# Patient Record
Sex: Female | Born: 1950 | Race: White | Hispanic: No | Marital: Married | Smoking: Former smoker
Health system: Southern US, Community
[De-identification: ages and names within clinical notes are randomized; demographics above are authoritative.]

## PROBLEM LIST (undated history)

## (undated) DIAGNOSIS — F419 Anxiety disorder, unspecified: Secondary | ICD-10-CM

## (undated) DIAGNOSIS — C801 Malignant (primary) neoplasm, unspecified: Secondary | ICD-10-CM

## (undated) DIAGNOSIS — I517 Cardiomegaly: Secondary | ICD-10-CM

## (undated) DIAGNOSIS — F329 Major depressive disorder, single episode, unspecified: Secondary | ICD-10-CM

## (undated) DIAGNOSIS — M1990 Unspecified osteoarthritis, unspecified site: Secondary | ICD-10-CM

## (undated) DIAGNOSIS — G4733 Obstructive sleep apnea (adult) (pediatric): Secondary | ICD-10-CM

## (undated) DIAGNOSIS — Z8711 Personal history of peptic ulcer disease: Secondary | ICD-10-CM

## (undated) DIAGNOSIS — Z8719 Personal history of other diseases of the digestive system: Secondary | ICD-10-CM

## (undated) DIAGNOSIS — I2510 Atherosclerotic heart disease of native coronary artery without angina pectoris: Secondary | ICD-10-CM

## (undated) DIAGNOSIS — I10 Essential (primary) hypertension: Secondary | ICD-10-CM

## (undated) DIAGNOSIS — K219 Gastro-esophageal reflux disease without esophagitis: Secondary | ICD-10-CM

## (undated) DIAGNOSIS — I219 Acute myocardial infarction, unspecified: Secondary | ICD-10-CM

## (undated) DIAGNOSIS — R011 Cardiac murmur, unspecified: Secondary | ICD-10-CM

## (undated) DIAGNOSIS — I444 Left anterior fascicular block: Secondary | ICD-10-CM

## (undated) DIAGNOSIS — E039 Hypothyroidism, unspecified: Secondary | ICD-10-CM

## (undated) DIAGNOSIS — Z9989 Dependence on other enabling machines and devices: Secondary | ICD-10-CM

## (undated) DIAGNOSIS — F32A Depression, unspecified: Secondary | ICD-10-CM

## (undated) DIAGNOSIS — Z862 Personal history of diseases of the blood and blood-forming organs and certain disorders involving the immune mechanism: Secondary | ICD-10-CM

## (undated) DIAGNOSIS — G5603 Carpal tunnel syndrome, bilateral upper limbs: Secondary | ICD-10-CM

## (undated) HISTORY — PX: COLONOSCOPY: SHX174

## (undated) HISTORY — PX: KNEE CARTILAGE SURGERY: SHX688

## (undated) HISTORY — DX: Morbid (severe) obesity due to excess calories: E66.01

## (undated) HISTORY — PX: UPPER GI ENDOSCOPY: SHX6162

## (undated) HISTORY — DX: Hypothyroidism, unspecified: E03.9

## (undated) HISTORY — PX: BACK SURGERY: SHX140

## (undated) HISTORY — PX: WISDOM TOOTH EXTRACTION: SHX21

## (undated) HISTORY — PX: ABDOMINAL HYSTERECTOMY: SHX81

## (undated) HISTORY — DX: Essential (primary) hypertension: I10

## (undated) HISTORY — PX: CHOLECYSTECTOMY: SHX55

## (undated) HISTORY — PX: CARPAL TUNNEL RELEASE: SHX101

## (undated) HISTORY — PX: CERVICAL FUSION: SHX112

---

## 1998-03-01 ENCOUNTER — Ambulatory Visit (HOSPITAL_COMMUNITY): Admission: RE | Admit: 1998-03-01 | Discharge: 1998-03-01 | Payer: Self-pay | Admitting: *Deleted

## 1999-08-30 ENCOUNTER — Other Ambulatory Visit: Admission: RE | Admit: 1999-08-30 | Discharge: 1999-08-30 | Payer: Self-pay | Admitting: *Deleted

## 2004-01-04 ENCOUNTER — Encounter: Admission: RE | Admit: 2004-01-04 | Discharge: 2004-04-03 | Payer: Self-pay | Admitting: *Deleted

## 2004-01-05 ENCOUNTER — Encounter: Admission: RE | Admit: 2004-01-05 | Discharge: 2004-01-05 | Payer: Self-pay | Admitting: *Deleted

## 2004-02-26 HISTORY — PX: LAPAROSCOPIC GASTRIC BANDING: SHX1100

## 2004-03-01 ENCOUNTER — Observation Stay (HOSPITAL_COMMUNITY): Admission: RE | Admit: 2004-03-01 | Discharge: 2004-03-02 | Payer: Self-pay | Admitting: *Deleted

## 2004-04-26 ENCOUNTER — Encounter: Admission: RE | Admit: 2004-04-26 | Discharge: 2004-07-25 | Payer: Self-pay | Admitting: *Deleted

## 2004-05-06 ENCOUNTER — Encounter: Admission: RE | Admit: 2004-05-06 | Discharge: 2004-05-06 | Payer: Self-pay | Admitting: *Deleted

## 2004-08-25 ENCOUNTER — Encounter: Admission: RE | Admit: 2004-08-25 | Discharge: 2004-11-23 | Payer: Self-pay | Admitting: *Deleted

## 2006-04-02 ENCOUNTER — Ambulatory Visit (HOSPITAL_COMMUNITY): Admission: RE | Admit: 2006-04-02 | Discharge: 2006-04-02 | Payer: Self-pay | Admitting: *Deleted

## 2007-01-24 ENCOUNTER — Ambulatory Visit (HOSPITAL_COMMUNITY): Admission: RE | Admit: 2007-01-24 | Discharge: 2007-01-24 | Payer: Self-pay | Admitting: *Deleted

## 2009-09-29 ENCOUNTER — Ambulatory Visit (HOSPITAL_COMMUNITY): Admission: RE | Admit: 2009-09-29 | Discharge: 2009-09-29 | Payer: Self-pay | Admitting: Surgery

## 2011-01-05 ENCOUNTER — Encounter (HOSPITAL_COMMUNITY): Payer: PRIVATE HEALTH INSURANCE | Attending: Surgery

## 2011-01-05 LAB — BASIC METABOLIC PANEL
Chloride: 106 mEq/L (ref 96–112)
GFR calc Af Amer: >60 mL/min (ref >60)
GFR calc non Af Amer: >60 mL/min (ref >60)
Potassium: 4.1 mEq/L (ref 3.5–5.1)

## 2011-01-05 LAB — CBC
Platelets: 261 10*3/uL (ref 150–400)
RBC: 4.52 MIL/uL (ref 3.87–5.11)
WBC: 7.5 10*3/uL (ref 4.0–10.5)

## 2011-01-05 LAB — SURGICAL PCR SCREEN
MRSA, PCR: NEGATIVE
Staphylococcus aureus: NEGATIVE

## 2011-01-06 ENCOUNTER — Ambulatory Visit (HOSPITAL_COMMUNITY)
Admission: RE | Admit: 2011-01-06 | Discharge: 2011-01-06 | Disposition: A | Discharge status: Home / Self Care | Payer: PRIVATE HEALTH INSURANCE | Attending: Surgery | Admitting: Surgery

## 2011-01-06 DIAGNOSIS — K9509 Other complications of gastric band procedure: Secondary | ICD-10-CM | POA: Insufficient documentation

## 2011-01-06 DIAGNOSIS — I10 Essential (primary) hypertension: Secondary | ICD-10-CM | POA: Insufficient documentation

## 2011-01-06 DIAGNOSIS — Y831 Surgical operation with implant of artificial internal device as the cause of abnormal reaction of the patient, or of later complication, without mention of misadventure at the time of the procedure: Secondary | ICD-10-CM | POA: Insufficient documentation

## 2011-01-06 DIAGNOSIS — G4733 Obstructive sleep apnea (adult) (pediatric): Secondary | ICD-10-CM | POA: Insufficient documentation

## 2011-01-06 DIAGNOSIS — E669 Obesity, unspecified: Secondary | ICD-10-CM | POA: Insufficient documentation

## 2011-01-08 NOTE — Op Note (Signed)
  [name], Elizabeth Bowen                  ACCOUNT NO.:  [id number]  MEDICAL RECORD NO.:  [id number]           PATIENT TYPE:  O  LOCATION:  DAYL                         FACILITY:  [facility]  PHYSICIAN:  Thornton Park. Daphine Deutscher, MD  DATE OF BIRTH:  November 22, 1951  DATE OF PROCEDURE:  01/06/2011 DATE OF DISCHARGE:                              OPERATIVE REPORT   PREOPERATIVE DIAGNOSIS:  Lap band port dysfunction.  The patient has had 10 cm Allergan band placed in April 2005.  I suspect the recent band only malpositioning, but malfunctioning with possible leakage.  PROCEDURE:  Replacement of lap band port and migration to subcutaneous pocket with mesh on the back.  SURGEON:  Thornton Park. Daphine Deutscher, MD  ANESTHESIA:  General.  DESCRIPTION OF PROCEDURE:  Elizabeth Bowen was taken to room #1 on Friday, January 06, 2011, and given general anesthesia.  Time-out was performed.  An incision was made and carried down to a very dense white fibrotic reaction around the band port.  This was freed up and posteriorly there was a real thick area stuck to the titanium of the port, and this was removed as well.  I then was able to explant it.  I removed its connection by cutting off, getting fresh tubing, and medially inserting the new port which I had previously sewn some Marlex mesh to the back.  This connection occurred with minimal spillage, and it was then threaded into the abdomen and was placed into the subcutaneous pocket.  The whole area was closed with 4-0 Vicryl subcutaneously and then subcuticularly with Benzoin and Steri-Strips. The patient seemed to tolerate the procedure well and was taken to the recovery room in satisfactory condition.     Thornton Park Daphine Deutscher, MD     MBM/MEDQ  D:  01/06/2011  T:  01/06/2011  Job:  [id number]  cc:   Fanny Dance, MD [facility]  Electronically Signed by Luretha Murphy MD on 01/06/2011 06:31:05 PM

## 2011-04-14 NOTE — Op Note (Signed)
[name], Elizabeth Bowen                  ACCOUNT NO.:  [id number]   MEDICAL RECORD NO.:  [id number]          PATIENT TYPE:  AMB   LOCATION:  DAY                          FACILITY:  [facility]   PHYSICIAN:  Alfonse Ras, MD   DATE OF BIRTH:  Sep 03, 1951   DATE OF PROCEDURE:  04/02/2006  DATE OF DISCHARGE:  04/02/2006                                 OPERATIVE REPORT   REPEAT OPERATIVE REPORT:   PREOPERATIVE DIAGNOSIS:  Malfunctioning LAP-BAND port.   POSTOPERATIVE DIAGNOSIS:  Malposition, LAP-BAND port.   PROCEDURE:  Revision of LAP-BAND port.   ANESTHESIA:  General.   SURGEON:  Alfonse Ras, M.D.   DESCRIPTION:  The patient was taken to the Operating Room.  Underwent  general anesthesia by endotracheal tube.  Abdomen was prepped and draped in  the normal sterile fashion.  Using the previous incision overlying the port,  dissected down to the port and excised the capsule surrounding it.  It was  slightly rotated in a cephalad position.  After all the capsule was removed,  it was tacked down to the anterior rectus fascia, again with interrupted 2-0  Prolene sutures.  The band was injected, and then fluid was let out, and  then reinjected with 1.5 cubic centimeters of saline.  The skin was closed  with subcuticular 4-0 Monocryl.  Steri-Strips, sterile dressings were  applied.  The patient tolerated the procedure well.      Alfonse Ras, MD  Electronically Signed     KRE/MEDQ  D:  05/19/2006  T:  05/19/2006  Job:  [id number]

## 2011-04-14 NOTE — Op Note (Signed)
Elizabeth Bowen, Elizabeth Bowen                            ACCOUNT NO.:  [id number]   MEDICAL RECORD NO.:  [id number]                   PATIENT TYPE:  OBV   LOCATION:  0447                                 [facility]ACILITY[facility]  [facility]   PHYSICIAN:  Vikki Ports, M.D.         DATE OF BIRTH:  07/22/1951   DATE OF PROCEDURE:  03/01/2004  DATE OF DISCHARGE:                                 OPERATIVE REPORT   PREOPERATIVE DIAGNOSIS:  Morbid obesity.   POSTOPERATIVE DIAGNOSIS:  Morbid obesity.   OPERATION/PROCEDURE:  Laparoscopic adjustable gastric banding.   SURGEON:  Vikki Ports, M.D.   ASSISTANT:  Thornton Park. Daphine Deutscher, M.D.   ANESTHESIA:  General.   DESCRIPTION OF PROCEDURE:  The patient was taken to the operating room,  placed in the supine position.  After adequate general anesthesia was  induced using the endotracheal tube, the abdomen was prepped and draped in  the normal sterile fashion.  Using an 11 mm incision in the left upper  quadrant, in the Optiview technique, an 11 mm trocar was placed under direct  visualization.  An additional 11 mm trocar was placed in the right upper  quadrant and a 12 mm trocar was placed in the right mid abdomen.  A 5 mm  trocar was placed in the left anterior axillary line.  Pneumoperitoneum was  obtained and sustained.  The angle of His was identified and gently  dissected through blunt technique and electrocautery.  The right crus of the  diaphragm was also identified after entering the gastrohepatic omentum.  The  vena cava was identified and using very gentle blunt technique, the lap band  passer  was placed through the retroesophageal position and brought out at  the angle of His.  A 12 mm trocar was removed and the 10 cm lap band was  introduced into the abdomen through the 12 mm trocar site.  It was then  passed around the upper stomach using the lap band passer.  It was closed  over the buckle and the esophageal tube was placed into the stomach,  insufflated with 20 mL of air and pulled back.  This showed excellent  placement of the lap band.  Anterior fixation was accomplished using  interrupted 2-0 Ethibond sutures and the tie knot.  The tubing of the lap  band was then brought out through the 12 mm site and attached to the port.  The port was then secured to the anterior rectus fascia using interrupted 2-  0 Prolene sutures.  All incisions were injected using Marcaine.  The toward  incision was closed with a subcutaneous 2-0 Vicryl.  Skin incisions were  closed with staples.  Pneumoperitoneum was released prior to closure.  Sterile dressings were applied.  The patient tolerated the procedure well  and went to the PACU in good condition.  Vikki Ports, M.D.    KRH/MEDQ  D:  03/01/2004  T:  03/01/2004  Job:  [id number]

## 2011-08-16 ENCOUNTER — Encounter (INDEPENDENT_AMBULATORY_CARE_PROVIDER_SITE_OTHER): Payer: Self-pay

## 2011-08-18 ENCOUNTER — Encounter (INDEPENDENT_AMBULATORY_CARE_PROVIDER_SITE_OTHER): Payer: Self-pay

## 2011-08-18 ENCOUNTER — Ambulatory Visit (INDEPENDENT_AMBULATORY_CARE_PROVIDER_SITE_OTHER): Payer: Self-pay | Admitting: Physician Assistant

## 2011-08-18 VITALS — BP 126/84 | HR 60 | Resp 16 | Ht 64.0 in | Wt 217.6 lb

## 2011-08-18 DIAGNOSIS — Z4651 Encounter for fitting and adjustment of gastric lap band: Secondary | ICD-10-CM

## 2011-08-18 NOTE — Progress Notes (Signed)
  HISTORY: Elizabeth Bowen is a 60 y.o.female who received an 10 cm lap-band in April 2005 by Dr. Colin Benton. She comes in with no new complaints other than tolerance of larger food portions and hunger. She denies vomiting or regurgitation.  VITAL SIGNS: Filed Vitals:   08/18/11 1424  BP: 126/84  Pulse: 60  Resp: 16    PHYSICAL EXAM: Physical exam reveals a very well-appearing 60 y.o.female in no apparent distress Neurologic: Awake, alert, oriented Psych: Bright affect, conversant Respiratory: Breathing even and unlabored. No stridor or wheezing Abdomen: Soft, nontender, nondistended to palpation. Incisions well-healed. No incisional hernias. Port easily palpated. Extremities: Atraumatic, good range of motion.  ASSESMENT: 60 y.o.  female  s/p 10cm lap-band.   PLAN: The patient's port was accessed with a 20G Huber needle without difficulty. Clear fluid was aspirated and 0.25 mL saline was added to the port to give a total predicted volume of 2.25 mL. The patient was able to swallow water without difficulty following the procedure and was instructed to take clear liquids for the next 24-48 hours and advance slowly as tolerated.

## 2011-08-18 NOTE — Patient Instructions (Signed)
Take clear liquids for the next 48 hours. Thin protein shakes are ok to start on Saturday evening. Call us if you have persistent vomiting or regurgitation, night cough or reflux symptoms. Return as scheduled or sooner if you notice no changes in hunger/portion sizes.   

## 2012-01-11 ENCOUNTER — Encounter (INDEPENDENT_AMBULATORY_CARE_PROVIDER_SITE_OTHER): Payer: Self-pay | Admitting: General Surgery

## 2012-06-06 ENCOUNTER — Encounter (INDEPENDENT_AMBULATORY_CARE_PROVIDER_SITE_OTHER): Payer: Self-pay

## 2012-06-06 ENCOUNTER — Ambulatory Visit (INDEPENDENT_AMBULATORY_CARE_PROVIDER_SITE_OTHER): Payer: PRIVATE HEALTH INSURANCE | Admitting: Surgery

## 2012-06-06 VITALS — BP 136/98 | Ht 64.0 in | Wt 215.0 lb

## 2012-06-06 DIAGNOSIS — Z4651 Encounter for fitting and adjustment of gastric lap band: Secondary | ICD-10-CM

## 2012-06-06 NOTE — Progress Notes (Signed)
  HISTORY: Elizabeth Bowen is a 61 y.o.female who received an 10 cm lap-band in April 2005 by Dr. Colin Benton. She comes in having been seen about 10 months ago and has lost almost three pounds. She complains of a month of frequent reflux symptoms with daytime cough. She denies night cough. She denies solid food intolerance as well. She's tried OTC antacids which help a little.  VITAL SIGNS: Filed Vitals:   06/06/12 0854  BP: 136/98    PHYSICAL EXAM: Physical exam reveals a very well-appearing 61 y.o.female in no apparent distress Neurologic: Awake, alert, oriented Psych: Bright affect, conversant Respiratory: Breathing even and unlabored. No stridor or wheezing Abdomen: Soft, nontender, nondistended to palpation. Incisions well-healed. No incisional hernias. Port easily palpated. Extremities: Atraumatic, good range of motion.  ASSESMENT: 61 y.o.  female  s/p 10cm lap-band.   PLAN: The patient's port was accessed with a 20G Huber needle without difficulty. Clear fluid was aspirated and 0.25 mL saline was removed from the port. The patient was able to swallow water without difficulty following the procedure and was instructed to take clear liquids for the next 24-48 hours and advance slowly as tolerated. I recommended omeprazole 20 mg daily for two weeks to help with her symptoms. We discussed an upper GI in lieu of some fluid removal. If her symptoms persist, we'll get the study.

## 2012-06-06 NOTE — Patient Instructions (Signed)
Return in 3 months or sooner if needed. Take omeprazole 20 mg (Prilosec) over-the-counter, one tablet daily for two weeks.

## 2012-09-12 ENCOUNTER — Ambulatory Visit (INDEPENDENT_AMBULATORY_CARE_PROVIDER_SITE_OTHER): Payer: PRIVATE HEALTH INSURANCE | Admitting: Physician Assistant

## 2012-09-12 ENCOUNTER — Encounter (INDEPENDENT_AMBULATORY_CARE_PROVIDER_SITE_OTHER): Payer: Self-pay

## 2012-09-12 VITALS — BP 138/72 | HR 63 | Ht 64.0 in | Wt 224.6 lb

## 2012-09-12 DIAGNOSIS — Z4651 Encounter for fitting and adjustment of gastric lap band: Secondary | ICD-10-CM

## 2012-09-12 NOTE — Patient Instructions (Signed)
Take clear liquids tonight. Thin protein shakes are ok to start tomorrow morning. Slowly advance your diet thereafter. Call us if you have persistent vomiting or regurgitation, night cough or reflux symptoms. Return as scheduled or sooner if you notice no changes in hunger/portion sizes.  

## 2012-09-12 NOTE — Progress Notes (Signed)
  HISTORY: Elizabeth Bowen is a 61 y.o.female who received an 10 cm lap-band in April 2005 by Dr. Daphine Deutscher. She comes in having had some fluid taken out in July. She's gained 9 lbs but fortunately she's had no further reflux. She is more hungry however and her portion sizes have gotten bigger. She would like an adjustment today.  VITAL SIGNS: Filed Vitals:   09/12/12 0917  BP: 138/72  Pulse: 63    PHYSICAL EXAM: Physical exam reveals a very well-appearing 61 y.o.female in no apparent distress Neurologic: Awake, alert, oriented Psych: Bright affect, conversant Respiratory: Breathing even and unlabored. No stridor or wheezing Abdomen: Soft, nontender, nondistended to palpation. Incisions well-healed. No incisional hernias. Port easily palpated. Extremities: Atraumatic, good range of motion.  ASSESMENT: 61 y.o.  female  s/p 10cm lap-band.   PLAN: The patient's port was accessed with a 20G Huber needle without difficulty. Clear fluid was aspirated and 0.2 mL saline was added to the port. The patient was able to swallow water without difficulty following the procedure and was instructed to take clear liquids for the next 24-48 hours and advance slowly as tolerated.

## 2013-01-11 ENCOUNTER — Other Ambulatory Visit: Payer: Self-pay

## 2013-07-31 ENCOUNTER — Encounter (INDEPENDENT_AMBULATORY_CARE_PROVIDER_SITE_OTHER): Payer: Commercial Managed Care - PPO

## 2013-08-07 ENCOUNTER — Other Ambulatory Visit (HOSPITAL_COMMUNITY): Payer: Self-pay | Admitting: *Deleted

## 2013-08-07 ENCOUNTER — Ambulatory Visit (HOSPITAL_COMMUNITY)
Admission: RE | Admit: 2013-08-07 | Discharge: 2013-08-07 | Disposition: A | Discharge status: Home / Self Care | Payer: 59 | Source: Ambulatory Visit | Attending: Nurse Practitioner | Admitting: Nurse Practitioner

## 2013-08-07 DIAGNOSIS — R202 Paresthesia of skin: Secondary | ICD-10-CM

## 2013-08-07 DIAGNOSIS — R200 Anesthesia of skin: Secondary | ICD-10-CM

## 2013-08-07 DIAGNOSIS — R519 Headache, unspecified: Secondary | ICD-10-CM

## 2013-08-07 DIAGNOSIS — R209 Unspecified disturbances of skin sensation: Secondary | ICD-10-CM | POA: Insufficient documentation

## 2013-08-07 DIAGNOSIS — R51 Headache: Secondary | ICD-10-CM | POA: Insufficient documentation

## 2013-08-08 ENCOUNTER — Ambulatory Visit (HOSPITAL_COMMUNITY): Payer: PRIVATE HEALTH INSURANCE

## 2013-08-14 ENCOUNTER — Other Ambulatory Visit (HOSPITAL_COMMUNITY): Payer: Self-pay | Admitting: Internal Medicine

## 2013-08-14 ENCOUNTER — Other Ambulatory Visit (HOSPITAL_COMMUNITY): Payer: Self-pay | Admitting: Nurse Practitioner

## 2013-08-14 DIAGNOSIS — R202 Paresthesia of skin: Secondary | ICD-10-CM

## 2013-08-19 ENCOUNTER — Ambulatory Visit (HOSPITAL_COMMUNITY)
Admission: RE | Admit: 2013-08-19 | Discharge: 2013-08-19 | Disposition: A | Discharge status: Home / Self Care | Payer: 59 | Source: Ambulatory Visit | Attending: Internal Medicine | Admitting: Internal Medicine

## 2013-08-19 DIAGNOSIS — Z981 Arthrodesis status: Secondary | ICD-10-CM | POA: Insufficient documentation

## 2013-08-19 DIAGNOSIS — R209 Unspecified disturbances of skin sensation: Secondary | ICD-10-CM | POA: Insufficient documentation

## 2013-08-19 DIAGNOSIS — M542 Cervicalgia: Secondary | ICD-10-CM | POA: Insufficient documentation

## 2013-08-19 DIAGNOSIS — M5380 Other specified dorsopathies, site unspecified: Secondary | ICD-10-CM | POA: Insufficient documentation

## 2013-08-19 DIAGNOSIS — R202 Paresthesia of skin: Secondary | ICD-10-CM

## 2013-08-21 ENCOUNTER — Encounter (INDEPENDENT_AMBULATORY_CARE_PROVIDER_SITE_OTHER): Payer: Commercial Managed Care - PPO

## 2013-09-04 ENCOUNTER — Encounter (INDEPENDENT_AMBULATORY_CARE_PROVIDER_SITE_OTHER): Payer: Self-pay

## 2013-09-04 ENCOUNTER — Ambulatory Visit (INDEPENDENT_AMBULATORY_CARE_PROVIDER_SITE_OTHER): Payer: Commercial Managed Care - PPO | Admitting: Physician Assistant

## 2013-09-04 VITALS — BP 148/80 | HR 76 | Resp 16 | Ht 64.0 in | Wt 228.6 lb

## 2013-09-04 DIAGNOSIS — Z4651 Encounter for fitting and adjustment of gastric lap band: Secondary | ICD-10-CM

## 2013-09-04 NOTE — Patient Instructions (Signed)

## 2013-09-04 NOTE — Progress Notes (Signed)
  HISTORY: Elizabeth Bowen is a 62 y.o.female who received an 10cm lap-band in April 2005 by Dr. Colin Bowen. She comes in one year after her last appointment with 4 lbs weight gain. She reports gradual increase in hunger and portion size but no regurgitation or reflux. She would like a fill today.  VITAL SIGNS: Filed Vitals:   09/04/13 1523  BP: 148/80  Pulse: 76  Resp: 16    PHYSICAL EXAM: Physical exam reveals a very well-appearing 62 y.o.female in no apparent distress Neurologic: Awake, alert, oriented Psych: Bright affect, conversant Respiratory: Breathing even and unlabored. No stridor or wheezing Abdomen: Soft, nontender, nondistended to palpation. Incisions well-healed. No incisional hernias. Port easily palpated. Extremities: Atraumatic, good range of motion.  ASSESMENT: 62 y.o.  female  s/p 10cm lap-band.   PLAN: The patient's port was accessed with a 20G Huber needle without difficulty. Clear fluid was aspirated and 0.3 mL saline was added to the port. The patient was able to swallow water without difficulty following the procedure and was instructed to take clear liquids for the next 24-48 hours and advance slowly as tolerated.

## 2013-10-02 ENCOUNTER — Other Ambulatory Visit: Payer: Self-pay

## 2013-12-04 ENCOUNTER — Encounter (INDEPENDENT_AMBULATORY_CARE_PROVIDER_SITE_OTHER): Payer: Commercial Managed Care - PPO

## 2013-12-04 ENCOUNTER — Telehealth (INDEPENDENT_AMBULATORY_CARE_PROVIDER_SITE_OTHER): Payer: Self-pay | Admitting: *Deleted

## 2013-12-04 NOTE — Telephone Encounter (Signed)
Called to see why pt missed her appt today with lapband clinic.  Left a message on answering machine if she wanted to reschedule to give the office a call.Marland Kitchenjkw

## 2013-12-09 ENCOUNTER — Encounter (INDEPENDENT_AMBULATORY_CARE_PROVIDER_SITE_OTHER): Payer: Self-pay | Admitting: Physician Assistant

## 2014-01-22 ENCOUNTER — Encounter (INDEPENDENT_AMBULATORY_CARE_PROVIDER_SITE_OTHER): Payer: Commercial Managed Care - PPO

## 2014-02-25 HISTORY — PX: LAPAROSCOPIC GASTRIC BANDING: SHX1100

## 2017-11-27 HISTORY — PX: CORONARY ANGIOPLASTY: SHX604

## 2018-05-21 DIAGNOSIS — I517 Cardiomegaly: Secondary | ICD-10-CM

## 2018-05-21 HISTORY — DX: Cardiomegaly: I51.7

## 2018-05-23 DIAGNOSIS — I444 Left anterior fascicular block: Secondary | ICD-10-CM

## 2018-05-23 HISTORY — DX: Left anterior fascicular block: I44.4

## 2018-11-01 ENCOUNTER — Other Ambulatory Visit (HOSPITAL_COMMUNITY): Payer: Self-pay | Admitting: Orthopedic Surgery

## 2018-11-01 DIAGNOSIS — M25511 Pain in right shoulder: Secondary | ICD-10-CM

## 2018-11-08 ENCOUNTER — Encounter (HOSPITAL_COMMUNITY): Payer: Self-pay

## 2018-11-08 ENCOUNTER — Ambulatory Visit (HOSPITAL_COMMUNITY): Payer: PRIVATE HEALTH INSURANCE

## 2019-01-14 ENCOUNTER — Other Ambulatory Visit: Payer: Self-pay | Admitting: Orthopaedic Surgery

## 2019-01-14 DIAGNOSIS — M25511 Pain in right shoulder: Secondary | ICD-10-CM

## 2019-01-17 ENCOUNTER — Other Ambulatory Visit: Payer: PRIVATE HEALTH INSURANCE

## 2019-01-17 ENCOUNTER — Ambulatory Visit
Admission: RE | Admit: 2019-01-17 | Discharge: 2019-01-17 | Disposition: A | Discharge status: Home / Self Care | Payer: Medicare Other | Source: Ambulatory Visit | Attending: Orthopaedic Surgery | Admitting: Orthopaedic Surgery

## 2019-01-17 DIAGNOSIS — M25511 Pain in right shoulder: Secondary | ICD-10-CM

## 2019-01-31 ENCOUNTER — Encounter (HOSPITAL_COMMUNITY): Payer: Self-pay

## 2019-01-31 NOTE — Patient Instructions (Addendum)
Your procedure is scheduled on: Wednesday, February 12, 2019   Surgery Time:  11:19AM-1:35PM   Report to [facility] Main  Entrance    Report to admitting at 8:45 AM   Call this number if you have problems the morning of surgery [phone]   Bring CPAP mask and tubing day of surgery   Do not eat food or drink liquids :After Midnight.   Brush your teeth the morning of surgery.   Do NOT smoke after Midnight   Take these medicines the morning of surgery with A SIP OF WATER: Amlodipine, Fluoxetine, Levothyroxine, Metoprolol3                               You may not have any metal on your body including hair pins, jewelry, and body piercings             Do not wear make-up, lotions, powders, perfumes/cologne, or deodorant             Do not wear nail polish.  Do not shave  48 hours prior to surgery.               Do not bring valuables to the hospital. [place].   Contacts, dentures or bridgework may not be worn into surgery.   Leave suitcase in the car. After surgery it may be brought to your room.    Special Instructions: Bring a copy of your healthcare power of attorney and living will documents         the day of surgery if you haven't scanned them in before.              Please read over the following fact sheets you were given: [facility] - Preparing for Surgery Before surgery, you can play an important role.  Because skin is not sterile, your skin needs to be as free of germs as possible.  You can reduce the number of germs on your skin by washing with CHG (chlorahexidine gluconate) soap before surgery.  CHG is an antiseptic cleaner which kills germs and bonds with the skin to continue killing germs even after washing. Please DO NOT use if you have an allergy to CHG or antibacterial soaps.  If your skin becomes reddened/irritated stop using the CHG and inform your nurse when you arrive at Short Stay. Do not shave  (including legs and underarms) for at least 48 hours prior to the first CHG shower.  You may shave your face/neck.  Please follow these instructions carefully:  1.  Shower with CHG Soap the night before surgery and the  morning of surgery.  2.  If you choose to wash your hair, wash your hair first as usual with your normal  shampoo.  3.  After you shampoo, rinse your hair and body thoroughly to remove the shampoo.                             4.  Use CHG as you would any other liquid soap.  You can apply chg directly to the skin and wash.  Gently with a scrungie or clean washcloth.  5.  Apply the CHG Soap to your body ONLY FROM THE NECK DOWN.   Do   not use  on face/ open                           Wound or open sores. Avoid contact with eyes, ears mouth and   genitals (private parts).                       Wash face,  Genitals (private parts) with your normal soap.             6.  Wash thoroughly, paying special attention to the area where your    surgery  will be performed.  7.  Thoroughly rinse your body with warm water from the neck down.  8.  DO NOT shower/wash with your normal soap after using and rinsing off the CHG Soap.                9.  Pat yourself dry with a clean towel.            10.  Wear clean pajamas.            11.  Place clean sheets on your bed the night of your first shower and do not  sleep with pets. Day of Surgery : Do not apply any lotions/deodorants the morning of surgery.  Please wear clean clothes to the hospital/surgery center.  FAILURE TO FOLLOW THESE INSTRUCTIONS MAY RESULT IN THE CANCELLATION OF YOUR SURGERY  PATIENT SIGNATURE_________________________________  NURSE SIGNATURE__________________________________  ________________________________________________________________________   Elizabeth Bowen  An incentive spirometer is a tool that can help keep your lungs clear and active. This tool measures how well you are filling your lungs with each breath.  Taking long deep breaths may help reverse or decrease the chance of developing breathing (pulmonary) problems (especially infection) following:  A long period of time when you are unable to move or be active. BEFORE THE PROCEDURE   If the spirometer includes an indicator to show your best effort, your nurse or respiratory therapist will set it to a desired goal.  If possible, sit up straight or lean slightly forward. Try not to slouch.  Hold the incentive spirometer in an upright position. INSTRUCTIONS FOR USE  1. Sit on the edge of your bed if possible, or sit up as far as you can in bed or on a chair. 2. Hold the incentive spirometer in an upright position. 3. Breathe out normally. 4. Place the mouthpiece in your mouth and seal your lips tightly around it. 5. Breathe in slowly and as deeply as possible, raising the piston or the ball toward the top of the column. 6. Hold your breath for 3-5 seconds or for as long as possible. Allow the piston or ball to fall to the bottom of the column. 7. Remove the mouthpiece from your mouth and breathe out normally. 8. Rest for a few seconds and repeat Steps 1 through 7 at least 10 times every 1-2 hours when you are awake. Take your time and take a few normal breaths between deep breaths. 9. The spirometer may include an indicator to show your best effort. Use the indicator as a goal to work toward during each repetition. 10. After each set of 10 deep breaths, practice coughing to be sure your lungs are clear. If you have an incision (the cut made at the time of surgery), support your incision when coughing by placing a pillow or rolled up towels firmly against it. Once you are able to get  out of bed, walk around indoors and cough well. You may stop using the incentive spirometer when instructed by your caregiver.  RISKS AND COMPLICATIONS  Take your time so you do not get dizzy or light-headed.  If you are in pain, you may need to take or ask for pain  medication before doing incentive spirometry. It is harder to take a deep breath if you are having pain. AFTER USE  Rest and breathe slowly and easily.  It can be helpful to keep track of a log of your progress. Your caregiver can provide you with a simple table to help with this. If you are using the spirometer at home, follow these instructions: Elizabeth Bowen IF:   You are having difficultly using the spirometer.  You have trouble using the spirometer as often as instructed.  Your pain medication is not giving enough relief while using the spirometer.  You develop fever of 100.5 F (38.1 C) or higher. SEEK IMMEDIATE MEDICAL CARE IF:   You cough up bloody sputum that had not been present before.  You develop fever of 102 F (38.9 C) or greater.  You develop worsening pain at or near the incision site. MAKE SURE YOU:   Understand these instructions.  Will watch your condition.  Will get help right away if you are not doing well or get worse. Document Released: 03/26/2007 Document Revised: 02/05/2012 Document Reviewed: 05/27/2007 [facility] Patient Information 2014 [place], [place].   ________________________________________________________________________

## 2019-02-03 ENCOUNTER — Other Ambulatory Visit: Payer: Self-pay

## 2019-02-03 ENCOUNTER — Encounter (HOSPITAL_COMMUNITY)
Admission: RE | Admit: 2019-02-03 | Discharge: 2019-02-03 | Disposition: A | Discharge status: Home / Self Care | Payer: Managed Care, Other (non HMO) | Source: Ambulatory Visit | Attending: Orthopaedic Surgery | Admitting: Orthopaedic Surgery

## 2019-02-03 ENCOUNTER — Encounter (HOSPITAL_COMMUNITY): Payer: Self-pay

## 2019-02-03 DIAGNOSIS — M19011 Primary osteoarthritis, right shoulder: Secondary | ICD-10-CM | POA: Diagnosis not present

## 2019-02-03 DIAGNOSIS — Z01818 Encounter for other preprocedural examination: Secondary | ICD-10-CM | POA: Insufficient documentation

## 2019-02-03 HISTORY — DX: Obstructive sleep apnea (adult) (pediatric): G47.33

## 2019-02-03 HISTORY — DX: Unspecified osteoarthritis, unspecified site: M19.90

## 2019-02-03 HISTORY — DX: Gastro-esophageal reflux disease without esophagitis: K21.9

## 2019-02-03 HISTORY — DX: Carpal tunnel syndrome, bilateral upper limbs: G56.03

## 2019-02-03 HISTORY — DX: Left anterior fascicular block: I44.4

## 2019-02-03 HISTORY — DX: Dependence on other enabling machines and devices: Z99.89

## 2019-02-03 HISTORY — DX: Cardiomegaly: I51.7

## 2019-02-03 HISTORY — DX: Atherosclerotic heart disease of native coronary artery without angina pectoris: I25.10

## 2019-02-03 HISTORY — DX: Major depressive disorder, single episode, unspecified: F32.9

## 2019-02-03 HISTORY — DX: Depression, unspecified: F32.A

## 2019-02-03 HISTORY — DX: Personal history of diseases of the blood and blood-forming organs and certain disorders involving the immune mechanism: Z86.2

## 2019-02-03 HISTORY — DX: Personal history of peptic ulcer disease: Z87.11

## 2019-02-03 HISTORY — DX: Personal history of other diseases of the digestive system: Z87.19

## 2019-02-03 LAB — SURGICAL PCR SCREEN
MRSA, PCR: NEGATIVE
Staphylococcus aureus: NEGATIVE

## 2019-02-03 LAB — BASIC METABOLIC PANEL
Anion gap: 11 (ref 5–15)
BUN: 19 mg/dL (ref 8–23)
CHLORIDE: 105 mmol/L (ref 98–111)
CO2: 24 mmol/L (ref 22–32)
Calcium: 9.3 mg/dL (ref 8.9–10.3)
Creatinine, Ser: 0.73 mg/dL (ref 0.44–1.00)
GFR calc Af Amer: >60 mL/min (ref >60)
GFR calc non Af Amer: >60 mL/min (ref >60)
Glucose, Bld: 100 mg/dL — ABNORMAL HIGH (ref 70–99)
POTASSIUM: 3.6 mmol/L (ref 3.5–5.1)
Sodium: 140 mmol/L (ref 135–145)

## 2019-02-03 LAB — CBC
HCT: 42.9 % (ref 36.0–46.0)
Hemoglobin: 13.3 g/dL (ref 12.0–15.0)
MCH: 28.9 pg (ref 26.0–34.0)
MCHC: 31 g/dL (ref 30.0–36.0)
MCV: 93.3 fL (ref 80.0–100.0)
Platelets: 272 10*3/uL (ref 150–400)
RBC: 4.6 MIL/uL (ref 3.87–5.11)
RDW: 13.6 % (ref 11.5–15.5)
WBC: 6.7 10*3/uL (ref 4.0–10.5)
nRBC: 0 % (ref 0.0–0.2)

## 2019-02-03 NOTE — Pre-Procedure Instructions (Signed)
EKGs from [facility] 05/21/2018-05/23/2018 in hard chart.

## 2019-02-03 NOTE — Pre-Procedure Instructions (Signed)
Last dose of plavix will be on March 12 per patient she was instructed by Dr. Griffin Basil.  Chart sent to [facility].A. to review EKGs from [facility]

## 2019-02-04 NOTE — Progress Notes (Addendum)
Anesthesia Chart Review   Case:  [id number] Date/Time:  02/12/19 1104   Procedure:  TOTAL SHOULDER ARTHROPLASTY (Right )   Anesthesia type:  Choice   Pre-op diagnosis:  OA RIGHT SHOULDER   Location:  [place] 06 / [facility] ORS   Surgeon:  Hiram Gash, MD      DISCUSSION: 68 yo former smoker with h/o HTN, hypothyroidism, depression, OSA on CPAP, LAFB, GERD, CAD (stent 12/2017), s/p lapband 02/2014, right shoulder OA scheduled for above procedure 02/12/19 with Dr. Ophelia Charter.    Clearance received from PCP, Dr. Joaquim Lai, 01/21/19 which states pt is optimized for surgery from a medical standpoint only.   Clearance received from cardiologist Dr. Humphrey Rolls 01/16/19 (on chart).  Per clearance pt is optimized for surgery from a cardiac standpoint only.  "Ok to stop Plavix 3-5 days prior to surgery and restart based on surgeon's discretion.  Please continue aspirin 81mg  PO daily (unless absolutely necessary).  Pt is considered low risk for right total shoulder replacement.  She has no unstable cardiac symptoms." Pt reports her last dose of Plavix is 02/06/19.   Pt can proceed with planned procedure barring acute status change.  VS: BP 131/88 (BP Location: Right Arm)   Pulse (!) 55   Temp 36.7 C (Oral)   Resp 18   Ht 5\' 4"  (1.626 m)   Wt 101.3 kg   SpO2 98%   BMI 38.33 kg/m   PROVIDERS: Joseph Art, MD is PCP   Berniece Pap, MD is Cardiologist  LABS: Labs reviewed: Acceptable for surgery. (all labs ordered are listed, but only abnormal results are displayed)  Labs Reviewed  BASIC METABOLIC PANEL - Abnormal; Notable for the following components:      Result Value   Glucose, Bld 100 (*)    All other components within normal limits  SURGICAL PCR SCREEN  CBC     IMAGES:   EKG: 05/21/18 (on chart) Rate 96 bpm Normal sinus rhythm Possible left atrial enlargement  LAFB Left ventricular hypertrophy with QRS widening Cannot rule out septal infarct, age undetermined  CV: Cardiac Cath  05/22/18 (on chart) Summary:  1. Normal left ventricular systolic function with mild anterior wall hypokinesia.  2. 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 Drug-eluting synergy 2.5 x 24 stent to the mid left anterior descending artery.  Recommendations:  1. Enteric-coated aspirin 81mg  p.o. daily long term 2. Plavix 75mg  p.o. daily long term 3. Statins long-term Past Medical History:  Diagnosis Date  . Arthritis   . Bilateral carpal tunnel syndrome   . Coronary artery disease   . Depression   . GERD (gastroesophageal reflux disease)   . History of anemia   . History of gallstones   . History of gastric ulcer   . History of hiatal hernia   . Hypertension   . Hypothyroidism   . 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 LVH (left ventricular hypertrophy) 05/21/2018   noted on EKG  . Morbid obesity ([place])   . OSA on CPAP     Past Surgical History:  Procedure Laterality Date  . ABDOMINAL HYSTERECTOMY    . BACK SURGERY     Lumbar disc  . CARPAL TUNNEL RELEASE Bilateral   . CERVICAL FUSION    . CHOLECYSTECTOMY    . COLONOSCOPY    . CORONARY ANGIOPLASTY  2019   Stent placement  . KNEE CARTILAGE SURGERY Bilateral    bilateral  .  LAPAROSCOPIC GASTRIC BANDING  02/2014  . UPPER GI ENDOSCOPY    . WISDOM TOOTH EXTRACTION      MEDICATIONS: . amLODipine (NORVASC) 10 MG tablet  . aspirin 81 MG tablet  . clopidogrel (PLAVIX) 75 MG tablet  . FLUoxetine (PROZAC) 20 MG capsule  . levothyroxine (SYNTHROID, LEVOTHROID) 125 MCG tablet  . metoprolol succinate (TOPROL-XL) 25 MG 24 hr tablet  . traMADol (ULTRAM) 50 MG tablet  . zolpidem (AMBIEN) 10 MG tablet   No current facility-administered medications for this encounter.     Maia Plan [facility] Pre-Surgical Testing [phone] 02/04/19 3:03 PM

## 2019-02-05 NOTE — Anesthesia Preprocedure Evaluation (Addendum)
Anesthesia Evaluation  Patient identified by MRN, date of birth, ID band Patient awake    Reviewed: Allergy & Precautions, NPO status , Patient's Chart, lab work & pertinent test results  Airway Mallampati: II  TM Distance: >3 FB Neck ROM: Full    Dental no notable dental hx. (+) Teeth Intact, Dental Advisory Given   Pulmonary sleep apnea and Continuous Positive Airway Pressure Ventilation , former smoker,    Pulmonary exam normal breath sounds clear to auscultation       Cardiovascular Exercise Tolerance: Good hypertension, Pt. on medications + CAD  Normal cardiovascular exam Rhythm:Regular Rate:Normal  EKG: 05/21/18 (on chart) Rate 96 bpm Normal sinus rhythm Possible left atrial enlargement  LAFB Left ventricular hypertrophy with QRS widening Cannot rule out septal infarct, age undetermined   Neuro/Psych PSYCHIATRIC DISORDERS Depression negative neurological ROS     GI/Hepatic GERD  ,  Endo/Other  Hypothyroidism   Renal/GU Cr 0.73 K+ 3.6     Musculoskeletal   Abdominal (+) + obese,   Peds  Hematology Hgb 13.3   Anesthesia Other Findings   Reproductive/Obstetrics                           Anesthesia Physical Anesthesia Plan  ASA: III  Anesthesia Plan: General   Post-op Pain Management:  Regional for Post-op pain   Induction: Intravenous  PONV Risk Score and Plan: 4 or greater and Treatment may vary due to age or medical condition, Dexamethasone and Ondansetron  Airway Management Planned: Oral ETT  Additional Equipment:   Intra-op Plan:   Post-operative Plan: Extubation in OR  Informed Consent: I have reviewed the patients History and Physical, chart, labs and discussed the procedure including the risks, benefits and alternatives for the proposed anesthesia with the patient or authorized representative who has indicated his/her understanding and acceptance.     Dental  advisory given  Plan Discussed with:   Anesthesia Plan Comments: (See PAT note 02/03/19, Konrad Felix, PA-C  R TSA w ISB w Exparel and GA )      Anesthesia Quick Evaluation

## 2019-02-11 NOTE — Progress Notes (Signed)
Patient denies symptoms of COVID 19. Instructed only one family member can come with her.

## 2019-02-12 ENCOUNTER — Encounter (HOSPITAL_COMMUNITY)
Admission: RE | Disposition: A | Discharge status: Home / Self Care | Payer: Self-pay | Source: Home / Self Care | Attending: Orthopaedic Surgery

## 2019-02-12 ENCOUNTER — Encounter (HOSPITAL_COMMUNITY): Payer: Self-pay | Admitting: Emergency Medicine

## 2019-02-12 ENCOUNTER — Inpatient Hospital Stay (HOSPITAL_COMMUNITY)
Admission: RE | Admit: 2019-02-12 | Discharge: 2019-02-13 | DRG: 483 | Disposition: A | Discharge status: Home / Self Care | Payer: Managed Care, Other (non HMO) | Attending: Orthopaedic Surgery | Admitting: Orthopaedic Surgery

## 2019-02-12 ENCOUNTER — Inpatient Hospital Stay (HOSPITAL_COMMUNITY): Payer: Managed Care, Other (non HMO)

## 2019-02-12 ENCOUNTER — Inpatient Hospital Stay (HOSPITAL_COMMUNITY): Payer: Managed Care, Other (non HMO) | Admitting: Anesthesiology

## 2019-02-12 ENCOUNTER — Other Ambulatory Visit: Payer: Self-pay

## 2019-02-12 ENCOUNTER — Inpatient Hospital Stay (HOSPITAL_COMMUNITY): Payer: Managed Care, Other (non HMO) | Admitting: Physician Assistant

## 2019-02-12 DIAGNOSIS — K219 Gastro-esophageal reflux disease without esophagitis: Secondary | ICD-10-CM | POA: Diagnosis present

## 2019-02-12 DIAGNOSIS — G4733 Obstructive sleep apnea (adult) (pediatric): Secondary | ICD-10-CM | POA: Diagnosis present

## 2019-02-12 DIAGNOSIS — Z6838 Body mass index (BMI) 38.0-38.9, adult: Secondary | ICD-10-CM

## 2019-02-12 DIAGNOSIS — Z87891 Personal history of nicotine dependence: Secondary | ICD-10-CM | POA: Diagnosis not present

## 2019-02-12 DIAGNOSIS — M19011 Primary osteoarthritis, right shoulder: Secondary | ICD-10-CM | POA: Diagnosis present

## 2019-02-12 DIAGNOSIS — Z955 Presence of coronary angioplasty implant and graft: Secondary | ICD-10-CM | POA: Diagnosis not present

## 2019-02-12 DIAGNOSIS — Z79890 Hormone replacement therapy (postmenopausal): Secondary | ICD-10-CM

## 2019-02-12 DIAGNOSIS — Z7982 Long term (current) use of aspirin: Secondary | ICD-10-CM

## 2019-02-12 DIAGNOSIS — Z7902 Long term (current) use of antithrombotics/antiplatelets: Secondary | ICD-10-CM | POA: Diagnosis not present

## 2019-02-12 DIAGNOSIS — Z90710 Acquired absence of both cervix and uterus: Secondary | ICD-10-CM | POA: Diagnosis not present

## 2019-02-12 DIAGNOSIS — F329 Major depressive disorder, single episode, unspecified: Secondary | ICD-10-CM | POA: Diagnosis present

## 2019-02-12 DIAGNOSIS — I10 Essential (primary) hypertension: Secondary | ICD-10-CM | POA: Diagnosis present

## 2019-02-12 DIAGNOSIS — Z09 Encounter for follow-up examination after completed treatment for conditions other than malignant neoplasm: Secondary | ICD-10-CM

## 2019-02-12 DIAGNOSIS — Z79899 Other long term (current) drug therapy: Secondary | ICD-10-CM

## 2019-02-12 DIAGNOSIS — E039 Hypothyroidism, unspecified: Secondary | ICD-10-CM | POA: Diagnosis present

## 2019-02-12 DIAGNOSIS — I2510 Atherosclerotic heart disease of native coronary artery without angina pectoris: Secondary | ICD-10-CM | POA: Diagnosis present

## 2019-02-12 HISTORY — PX: TOTAL SHOULDER ARTHROPLASTY: SHX126

## 2019-02-12 SURGERY — ARTHROPLASTY, SHOULDER, TOTAL
Anesthesia: General | Laterality: Right

## 2019-02-12 MED ORDER — HYDROMORPHONE HCL 1 MG/ML IJ SOLN: 0.5000 mg | INTRAMUSCULAR | Status: DC | PRN

## 2019-02-12 MED ORDER — BUPIVACAINE LIPOSOME 1.3 % IJ SUSP
INTRAMUSCULAR | Status: DC | PRN
  Administered 2019-02-12: 10 mL via PERINEURAL

## 2019-02-12 MED ORDER — ONDANSETRON HCL 4 MG/2ML IJ SOLN: 4.0000 mg | Freq: Once | INTRAMUSCULAR | Status: DC | PRN

## 2019-02-12 MED ORDER — MIDAZOLAM HCL 2 MG/2ML IJ SOLN
1.0000 mg | INTRAMUSCULAR | Status: AC
  Administered 2019-02-12: 1 mg via INTRAVENOUS

## 2019-02-12 MED ORDER — PROPOFOL 10 MG/ML IV BOLUS
INTRAVENOUS | Status: DC | PRN
  Administered 2019-02-12: 150 mg via INTRAVENOUS

## 2019-02-12 MED ORDER — FENTANYL CITRATE (PF) 100 MCG/2ML IJ SOLN
25.0000 ug | INTRAMUSCULAR | Status: DC | PRN
  Administered 2019-02-12 (×2): 50 ug via INTRAVENOUS

## 2019-02-12 MED ORDER — MIDAZOLAM HCL 2 MG/2ML IJ SOLN
INTRAMUSCULAR | Status: AC
  Administered 2019-02-12: 1 mg via INTRAVENOUS
  Filled 2019-02-12: qty 2

## 2019-02-12 MED ORDER — PHENYLEPHRINE HCL-NACL 10-0.9 MG/250ML-% IV SOLN
INTRAVENOUS | Status: AC
  Filled 2019-02-12: qty 250

## 2019-02-12 MED ORDER — BUPIVACAINE-EPINEPHRINE (PF) 0.5% -1:200000 IJ SOLN
INTRAMUSCULAR | Status: DC | PRN
  Administered 2019-02-12: 15 mL via PERINEURAL

## 2019-02-12 MED ORDER — SODIUM CHLORIDE 0.9 % IR SOLN
Status: DC | PRN
  Administered 2019-02-12: 1000 mL

## 2019-02-12 MED ORDER — ONDANSETRON HCL 4 MG/2ML IJ SOLN: 4.0000 mg | Freq: Four times a day (QID) | INTRAMUSCULAR | Status: DC | PRN

## 2019-02-12 MED ORDER — DIPHENHYDRAMINE HCL 12.5 MG/5ML PO ELIX: 12.5000 mg | ORAL_SOLUTION | ORAL | Status: DC | PRN

## 2019-02-12 MED ORDER — TRANEXAMIC ACID-NACL 1000-0.7 MG/100ML-% IV SOLN
1000.0000 mg | INTRAVENOUS | Status: AC
  Administered 2019-02-12: 1000 mg via INTRAVENOUS
  Filled 2019-02-12: qty 100

## 2019-02-12 MED ORDER — LACTATED RINGERS IV SOLN
INTRAVENOUS | Status: DC
  Administered 2019-02-12 (×2): via INTRAVENOUS

## 2019-02-12 MED ORDER — LIDOCAINE 2% (20 MG/ML) 5 ML SYRINGE
INTRAMUSCULAR | Status: DC | PRN
  Administered 2019-02-12: 40 mg via INTRAVENOUS

## 2019-02-12 MED ORDER — VANCOMYCIN HCL 1000 MG IV SOLR
INTRAVENOUS | Status: DC | PRN
  Administered 2019-02-12: 1000 mg

## 2019-02-12 MED ORDER — ONDANSETRON HCL 4 MG PO TABS: 4.0000 mg | ORAL_TABLET | Freq: Four times a day (QID) | ORAL | Status: DC | PRN

## 2019-02-12 MED ORDER — DOCUSATE SODIUM 100 MG PO CAPS
100.0000 mg | ORAL_CAPSULE | Freq: Two times a day (BID) | ORAL | Status: DC
  Administered 2019-02-12 – 2019-02-13 (×2): 100 mg via ORAL
  Filled 2019-02-12 (×2): qty 1

## 2019-02-12 MED ORDER — FENTANYL CITRATE (PF) 100 MCG/2ML IJ SOLN
50.0000 ug | INTRAMUSCULAR | Status: AC
  Administered 2019-02-12: 50 ug via INTRAVENOUS

## 2019-02-12 MED ORDER — VANCOMYCIN HCL 1000 MG IV SOLR
INTRAVENOUS | Status: AC
  Filled 2019-02-12: qty 1000

## 2019-02-12 MED ORDER — METOCLOPRAMIDE HCL 5 MG/ML IJ SOLN: 5.0000 mg | Freq: Three times a day (TID) | INTRAMUSCULAR | Status: DC | PRN

## 2019-02-12 MED ORDER — FENTANYL CITRATE (PF) 100 MCG/2ML IJ SOLN
INTRAMUSCULAR | Status: DC | PRN
  Administered 2019-02-12 (×2): 25 ug via INTRAVENOUS
  Administered 2019-02-12: 50 ug via INTRAVENOUS

## 2019-02-12 MED ORDER — DEXAMETHASONE SODIUM PHOSPHATE 10 MG/ML IJ SOLN
INTRAMUSCULAR | Status: DC | PRN
  Administered 2019-02-12: 10 mg via INTRAVENOUS

## 2019-02-12 MED ORDER — EPHEDRINE 5 MG/ML INJ
INTRAVENOUS | Status: AC
  Filled 2019-02-12: qty 10

## 2019-02-12 MED ORDER — ONDANSETRON HCL 4 MG/2ML IJ SOLN
INTRAMUSCULAR | Status: DC | PRN
  Administered 2019-02-12: 4 mg via INTRAVENOUS

## 2019-02-12 MED ORDER — ACETAMINOPHEN 10 MG/ML IV SOLN: 1000.0000 mg | Freq: Once | INTRAVENOUS | Status: DC | PRN

## 2019-02-12 MED ORDER — EPHEDRINE SULFATE-NACL 50-0.9 MG/10ML-% IV SOSY
PREFILLED_SYRINGE | INTRAVENOUS | Status: DC | PRN
  Administered 2019-02-12: 5 mg via INTRAVENOUS
  Administered 2019-02-12: 10 mg via INTRAVENOUS

## 2019-02-12 MED ORDER — SODIUM CHLORIDE 0.9 % IV SOLN: INTRAVENOUS | Status: DC | PRN

## 2019-02-12 MED ORDER — SUGAMMADEX SODIUM 500 MG/5ML IV SOLN
INTRAVENOUS | Status: DC | PRN
  Administered 2019-02-12: 400 mg via INTRAVENOUS

## 2019-02-12 MED ORDER — FENTANYL CITRATE (PF) 100 MCG/2ML IJ SOLN
INTRAMUSCULAR | Status: AC
  Administered 2019-02-12: 50 ug via INTRAVENOUS
  Filled 2019-02-12: qty 2

## 2019-02-12 MED ORDER — NAPROXEN 250 MG PO TABS
250.0000 mg | ORAL_TABLET | Freq: Two times a day (BID) | ORAL | Status: DC
  Filled 2019-02-12: qty 1

## 2019-02-12 MED ORDER — SODIUM CHLORIDE 0.9 % IV SOLN
INTRAVENOUS | Status: DC | PRN
  Administered 2019-02-12: 15 ug/min via INTRAVENOUS

## 2019-02-12 MED ORDER — CEFAZOLIN SODIUM-DEXTROSE 2-4 GM/100ML-% IV SOLN
2.0000 g | INTRAVENOUS | Status: AC
  Administered 2019-02-12: 2 g via INTRAVENOUS
  Filled 2019-02-12: qty 100

## 2019-02-12 MED ORDER — ACETAMINOPHEN 500 MG PO TABS
1000.0000 mg | ORAL_TABLET | Freq: Three times a day (TID) | ORAL | Status: DC
  Administered 2019-02-12 – 2019-02-13 (×3): 1000 mg via ORAL
  Filled 2019-02-12 (×3): qty 2

## 2019-02-12 MED ORDER — ROCURONIUM BROMIDE 10 MG/ML (PF) SYRINGE
PREFILLED_SYRINGE | INTRAVENOUS | Status: DC | PRN
  Administered 2019-02-12: 10 mg via INTRAVENOUS
  Administered 2019-02-12: 80 mg via INTRAVENOUS

## 2019-02-12 MED ORDER — ZOLPIDEM TARTRATE 5 MG PO TABS: 5.0000 mg | ORAL_TABLET | Freq: Every evening | ORAL | Status: DC | PRN

## 2019-02-12 MED ORDER — OXYCODONE HCL 5 MG PO TABS
5.0000 mg | ORAL_TABLET | ORAL | Status: DC | PRN
  Administered 2019-02-12 – 2019-02-13 (×3): 10 mg via ORAL
  Filled 2019-02-12 (×3): qty 2

## 2019-02-12 MED ORDER — FENTANYL CITRATE (PF) 100 MCG/2ML IJ SOLN
INTRAMUSCULAR | Status: AC
  Filled 2019-02-12: qty 2

## 2019-02-12 MED ORDER — 0.9 % SODIUM CHLORIDE (POUR BTL) OPTIME
TOPICAL | Status: DC | PRN
  Administered 2019-02-12: 1000 mL

## 2019-02-12 MED ORDER — CEFAZOLIN SODIUM-DEXTROSE 1-4 GM/50ML-% IV SOLN
1.0000 g | Freq: Four times a day (QID) | INTRAVENOUS | Status: AC
  Administered 2019-02-12 – 2019-02-13 (×3): 1 g via INTRAVENOUS
  Filled 2019-02-12 (×3): qty 50

## 2019-02-12 MED ORDER — BUPIVACAINE-EPINEPHRINE (PF) 0.25% -1:200000 IJ SOLN
INTRAMUSCULAR | Status: AC
  Filled 2019-02-12: qty 30

## 2019-02-12 MED ORDER — CHLORHEXIDINE GLUCONATE 4 % EX LIQD: 60.0000 mL | Freq: Once | CUTANEOUS | Status: DC

## 2019-02-12 MED ORDER — METOCLOPRAMIDE HCL 5 MG PO TABS: 5.0000 mg | ORAL_TABLET | Freq: Three times a day (TID) | ORAL | Status: DC | PRN

## 2019-02-12 MED ORDER — STERILE WATER FOR IRRIGATION IR SOLN
Status: DC | PRN
  Administered 2019-02-12: 2000 mL

## 2019-02-12 SURGICAL SUPPLY — 61 items
BENZOIN TINCTURE PRP APPL 2/3 (GAUZE/BANDAGES/DRESSINGS) ×2 IMPLANT
BLADE HEX COATED 2.75 (ELECTRODE) ×2 IMPLANT
BLADE SAW SAG 29X58X.64 (BLADE) IMPLANT
BLADE SAW SAG 73X25 THK (BLADE) ×1
BLADE SAW SGTL 73X25 THK (BLADE) ×1 IMPLANT
CEMENT BONE DEPUY (Cement) ×2 IMPLANT
CHLORAPREP W/TINT 26ML (MISCELLANEOUS) ×4 IMPLANT
COMP SM RT 15D AUG GLENOID (Miscellaneous) ×2 IMPLANT
COMPONENT SMRT 15D AUG GLENOID (Miscellaneous) ×1 IMPLANT
COVER SURGICAL LIGHT HANDLE (MISCELLANEOUS) ×2 IMPLANT
COVER WAND RF STERILE (DRAPES) ×2 IMPLANT
DRAPE INCISE IOBAN 66X45 STRL (DRAPES) ×4 IMPLANT
DRAPE ORTHO SPLIT 77X108 STRL (DRAPES) ×3
DRAPE SURG ORHT 6 SPLT 77X108 (DRAPES) ×3 IMPLANT
DRSG AQUACEL AG ADV 3.5X 6 (GAUZE/BANDAGES/DRESSINGS) ×2 IMPLANT
ELECT BLADE TIP CTD 4 INCH (ELECTRODE) ×2 IMPLANT
ELECT REM PT RETURN 9FT ADLT (ELECTROSURGICAL) ×2
ELECTRODE REM PT RTRN 9FT ADLT (ELECTROSURGICAL) ×1 IMPLANT
GLOVE BIOGEL PI IND STRL 8 (GLOVE) ×1 IMPLANT
GLOVE BIOGEL PI INDICATOR 8 (GLOVE) ×1
GLOVE ECLIPSE 8.0 STRL XLNG CF (GLOVE) ×4 IMPLANT
GOWN STRL REUS W/ TWL XL LVL3 (GOWN DISPOSABLE) ×1 IMPLANT
GOWN STRL REUS W/TWL 2XL LVL3 (GOWN DISPOSABLE) ×2 IMPLANT
GOWN STRL REUS W/TWL XL LVL3 (GOWN DISPOSABLE) ×1
GUIDEWIRE GLENOID 2.5X220 (WIRE) ×2 IMPLANT
HANDPIECE INTERPULSE COAX TIP (DISPOSABLE)
HEAD HUM AEQUALIS 43X16 (Head) ×2 IMPLANT
KIT BASIN OR (CUSTOM PROCEDURE TRAY) ×2 IMPLANT
KIT STABILIZATION SHOULDER (MISCELLANEOUS) ×2 IMPLANT
KIT TURNOVER KIT A (KITS) ×2 IMPLANT
MANIFOLD NEPTUNE II (INSTRUMENTS) ×2 IMPLANT
NEEDLE HYPO 25X1 1.5 SAFETY (NEEDLE) IMPLANT
NEEDLE MAYO CATGUT SZ4 (NEEDLE) IMPLANT
NS IRRIG 1000ML POUR BTL (IV SOLUTION) ×2 IMPLANT
PACK SHOULDER (CUSTOM PROCEDURE TRAY) ×2 IMPLANT
PAD ARMBOARD 7.5X6 YLW CONV (MISCELLANEOUS) ×4 IMPLANT
RESTRAINT HEAD UNIVERSAL NS (MISCELLANEOUS) ×2 IMPLANT
SET HNDPC FAN SPRY TIP SCT (DISPOSABLE) IMPLANT
SLING ULTRA III MED (ORTHOPEDIC SUPPLIES) ×2 IMPLANT
SMARTMIX MINI TOWER (MISCELLANEOUS)
SPONGE LAP 18X18 RF (DISPOSABLE) ×2 IMPLANT
STEM HUMERAL PTC STND SZ2A (Joint) ×2 IMPLANT
STEM HUMERAL PTCSTD SZ2A (Joint) ×1 IMPLANT
STRIP CLOSURE SKIN 1/2X4 (GAUZE/BANDAGES/DRESSINGS) ×2 IMPLANT
SUCTION FRAZIER HANDLE 10FR (MISCELLANEOUS) ×1
SUCTION TUBE FRAZIER 10FR DISP (MISCELLANEOUS) ×1 IMPLANT
SUT ETHIBOND 2 V 37 (SUTURE) ×2 IMPLANT
SUT ETHIBOND NAB CT1 #1 30IN (SUTURE) ×2 IMPLANT
SUT FIBERWIRE #5 38 CONV NDL (SUTURE) ×12
SUT MON AB 3-0 SH 27 (SUTURE) ×1
SUT MON AB 3-0 SH27 (SUTURE) ×1 IMPLANT
SUT VIC AB 0 CT1 18XCR BRD 8 (SUTURE) ×1 IMPLANT
SUT VIC AB 0 CT1 8-18 (SUTURE) ×1
SUT VIC AB 2-0 CT1 27 (SUTURE) ×1
SUT VIC AB 2-0 CT1 TAPERPNT 27 (SUTURE) ×1 IMPLANT
SUTURE FIBERWR #5 38 CONV NDL (SUTURE) ×6 IMPLANT
Standard PTC Humeral Stem (Shoulder) ×2 IMPLANT
TOWEL OR 17X26 10 PK STRL BLUE (TOWEL DISPOSABLE) ×2 IMPLANT
TOWER CARTRIDGE SMART MIX (DISPOSABLE) ×2 IMPLANT
TOWER SMARTMIX MINI (MISCELLANEOUS) IMPLANT
WATER STERILE IRR 1000ML POUR (IV SOLUTION) ×2 IMPLANT

## 2019-02-12 NOTE — Transfer of Care (Signed)
Immediate Anesthesia Transfer of Care Note  Patient: Elizabeth Bowen  Procedure(s) Performed: Procedure(s): TOTAL SHOULDER ARTHROPLASTY (Right)  Patient Location: PACU  Anesthesia Type:GA combined with regional for post-op pain  Level of Consciousness:  sedated, patient cooperative and responds to stimulation  Airway & Oxygen Therapy:Patient Spontanous Breathing and Patient connected to face mask oxgen  Post-op Assessment:  Report given to PACU RN and Post -op Vital signs reviewed and stable  Post vital signs:  Reviewed and stable  Last Vitals:  Vitals:   02/12/19 1015 02/12/19 1335  BP:    Pulse: 67   Resp: 17   Temp:  (P) 36.7 C  SpO2: 97%     Complications: No apparent anesthesia complications

## 2019-02-12 NOTE — H&P (Signed)
PREOPERATIVE H&P  Chief Complaint: OA RIGHT SHOULDER  HPI: Elizabeth Bowen is a 68 y.o. female who presents for preoperative history and physical with a diagnosis of OA RIGHT SHOULDER. Symptoms are rated as moderate to severe, and have been worsening.  This is significantly impairing activities of daily living.  Please see my clinic note for full details on this patient's care.  She has elected for surgical management.   Past Medical History:  Diagnosis Date  . Arthritis   . Bilateral carpal tunnel syndrome   . Coronary artery disease   . Depression   . GERD (gastroesophageal reflux disease)   . History of anemia   . History of gallstones   . History of gastric ulcer   . History of hiatal hernia   . Hypertension   . Hypothyroidism   . 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 LVH (left ventricular hypertrophy) 05/21/2018   noted on EKG  . Morbid obesity ([place])   . OSA on CPAP    Past Surgical History:  Procedure Laterality Date  . ABDOMINAL HYSTERECTOMY    . BACK SURGERY     Lumbar disc  . CARPAL TUNNEL RELEASE Bilateral   . CERVICAL FUSION    . CHOLECYSTECTOMY    . COLONOSCOPY    . CORONARY ANGIOPLASTY  2019   Stent placement  . KNEE CARTILAGE SURGERY Bilateral    bilateral  . LAPAROSCOPIC GASTRIC BANDING  02/2014  . UPPER GI ENDOSCOPY    . WISDOM TOOTH EXTRACTION     Social History   Socioeconomic History  . Marital status: Single    Spouse name: Not on file  . Number of children: Not on file  . Years of education: Not on file  . Highest education level: Not on file  Occupational History  . Not on file  Social Needs  . Financial resource strain: Not on file  . Food insecurity:    Worry: Not on file    Inability: Not on file  . Transportation needs:    Medical: Not on file    Non-medical: Not on file  Tobacco Use  . Smoking status: Former Smoker    Packs/day: 2.00    Types: Cigarettes  . Smokeless tobacco: Never Used  .  Tobacco comment: quit 20 years ago, smoked off and on 15 years  Substance and Sexual Activity  . Alcohol use: Not Currently  . Drug use: No  . Sexual activity: Not on file  Lifestyle  . Physical activity:    Days per week: Not on file    Minutes per session: Not on file  . Stress: Not on file  Relationships  . Social connections:    Talks on phone: Not on file    Gets together: Not on file    Attends religious service: Not on file    Active member of club or organization: Not on file    Attends meetings of clubs or organizations: Not on file    Relationship status: Not on file  Other Topics Concern  . Not on file  Social History Narrative  . Not on file   Family History  Problem Relation Age of Onset  . Lung cancer Father   . Lung cancer Mother   . Sick sinus syndrome Mother    Allergies  Allergen Reactions  . Statins Other (See Comments)    Leg pain   . Sulfa Antibiotics Rash  . Vioxx [Rofecoxib]  Rash   Prior to Admission medications   Medication Sig Start Date End Date Taking? Authorizing Provider  aspirin 81 MG tablet Take 81 mg by mouth daily.   Yes [provider]  clopidogrel (PLAVIX) 75 MG tablet Take 75 mg by mouth daily.   Yes [provider]  FLUoxetine (PROZAC) 20 MG capsule Take 40 mg by mouth daily.    Yes [provider]  levothyroxine (SYNTHROID, LEVOTHROID) 125 MCG tablet Take 125 mcg by mouth daily before breakfast.    Yes [provider]  metoprolol succinate (TOPROL-XL) 25 MG 24 hr tablet Take 12.5 mg by mouth daily.   Yes [provider]  traMADol (ULTRAM) 50 MG tablet Take 50 mg by mouth every 6 (six) hours as needed (pain).   Yes [provider]  zolpidem (AMBIEN) 10 MG tablet Take 10 mg by mouth at bedtime as needed for sleep.    Yes [provider]  amLODipine (NORVASC) 10 MG tablet Take 10 mg by mouth daily.     [provider]     Positive ROS: All other systems have been  reviewed and were otherwise negative with the exception of those mentioned in the HPI and as above.  Physical Exam: General: Alert, no acute distress Cardiovascular: No pedal edema Respiratory: No cyanosis, no use of accessory musculature GI: No organomegaly, abdomen is soft and non-tender Skin: No lesions in the area of chief complaint Neurologic: Sensation intact distally Psychiatric: Patient is competent for consent with normal mood and affect Lymphatic: No axillary or cervical lymphadenopathy  MUSCULOSKELETAL: Right shoulder: limited ROM, cuff strength intact  Assessment: OA RIGHT SHOULDER  Plan: Plan for Procedure(s): TOTAL SHOULDER ARTHROPLASTY  The risks benefits and alternatives were discussed with the patient including but not limited to the risks of nonoperative treatment, versus surgical intervention including infection, bleeding, nerve injury,  blood clots, cardiopulmonary complications, morbidity, mortality, among others, and they were willing to proceed.  We additionally specifically discussed risks of axillary nerve injury, infection, periprosthetic fracture, continued pain and longevity of implants prior to beginning procedure.    Hiram Gash, MD  02/12/2019 10:13 AM

## 2019-02-12 NOTE — Op Note (Signed)
Orthopaedic Surgery Operative Note (CSN: [id number])  Elizabeth Bowen  01-22-1951 Date of Surgery: 02/12/2019   Diagnoses:  OA RIGHT SHOULDER, B2 glenoid  Procedure: Right augmented  Total Shoulder Arthroplasty   Operative Finding Successful completion of planned procedure.  Augmented poly-used, great fixation of the subscap.  Implants: 43 high offset head, to a stem, augmented 15 degree small glenoid component.  Post-operative plan: The patient will be NWB in sling.  The patient will be admitted overnight.  DVT prophylaxis not indicated in isolated upper extremity surgery patient with no specific risks factors.  Pain control with PRN pain medication preferring oral medicines.  Follow up plan will be scheduled in approximately 7 days for incision check and XR.  Physical therapy to start after first visit.  Post-Op Diagnosis: Same Surgeons:Primary: Hiram Gash, MD Assistants:Brandon Lynnell Jude Location: [facility] ROOM 06 Anesthesia: Choice Antibiotics: Ancef 2g preop, Vancomycin 1073m locally Tourniquet time: None Estimated Blood Loss: 1502Complications: None Specimens: None Implants: Implant Name Type Inv. Item Serial No. Manufacturer Lot No. LRB No. Used Action  CEMENT BONE DEPUY - [id number] CEMENT BONE DEPUY [id number] DEPUY SYNTHES [id number] 1 Implanted  CORTILOC GLENOID AUGMENT SIZE SMALL RIGHT ANGLE 15   [id number]  Right 1 Implanted  HEAD HUM AEQUALIS 43X16 - [id number] HEAD HOren Binet43X16 [id number]  Right 1 Implanted  Standard PTC Humeral Stem Shoulder  [id number]  Right 1 Implanted    Indications for Surgery:   NLATESE DUFAULTis a 68y.o. female with contineud right shoulder pain refractory to nonoperative measures.  Benefits and risks of operative and nonoperative management were discussed prior to surgery with patient/guardian(s) and informed consent form was completed.  Infection and need for further surgery were discussed  as was prosthetic stability and cuff issues.  We additionally specifically discussed risks of axillary nerve injury, infection, periprosthetic fracture, continued pain and longevity of implants prior to beginning procedure.      Procedure:   The patient was identified in the preoperative holding area where the surgical site was marked. Block placed by anesthesia with exparel.  The patient was taken to the OR where a procedural timeout was called and the above noted anesthesia was induced.  The patient was positioned beachchair on allen table with spider arm positioner.  Preoperative antibiotics were dosed.  The patient's right shoulder was prepped and draped in the usual sterile fashion.  A second preoperative timeout was called.       Standard deltopectoral approach was performed with a #10 blade. We dissected down to the subcutaneous tissues and the cephalic vein was taken laterally with the deltoid. Clavipectoral fascia was incised in line with the incision. Deep retractors were placed. The long of the biceps tendon was identified and there was significant tenosynovitis present.  Tenodesis was performed to the pectoralis tendon with #2 Ethibond. The remaining biceps was followed up into the rotator interval where it was released. The subscapularis was taken down with a lesser tuberosity osteotomy using an osteotome. The osteotomy fragment and underlying capsular elevated off of the humeral neck and the osteophytes inferiorly. #2 Ethibond sutures are passed through the bone tendon junction for subscap manipulation. We continued releasing the capsule directly off of the osteophytes inferiorly all the way around the corner. This allowed uKoreato dislocate the humeral head. The humeral head had evidence of severe osteoarthritic wear with full-thickness cartilage loss and exposed subchondral bone. There was significant flattening of  the humeral head.   The rotator cuff was carefully examined and noted to be  intact without sign of wear.  The decision was confirmed that an anatomic total shoulder was indicated for this patient.  There were osteophytes along the inferior humeral neck. The osteophytes were removed with an osteotome and a rongeur.  Osteophytes were removed with a rongeur and an osteotome and the anatomic neck was well visualized.   We next made our humeral osteotomy with an oscillating saw along the anatomic neck. The head fragment was passed off the back table and measured approximately 43 mm in diameter. A starter awl was used to open the humeral canal. We reamed with T-handle sound  reamers up an appropriate fit. The chisel was used to remove proximal humeral bone. We then broached starting with a size 1 broach increasing to size 2 which had secure and stable fit. The inclination was set at A. The broach handle was removed and a cut protector was placed. The humerus was retracted posteriorly and we turned our attention to glenoid exposure.  The subscapularis was again identified and immediately we took care to palpate the axillary nerve anteriorly and verify its position with gentle palpation as well as the tug test.  We then released the SGHL with bovie cautery prior to placing a curved mayo at the junction of the anterior glenoid well above the axillary nerve and bluntly dissecting the subscapularis from the capsule.  We then carefully protected the axillary nerve as we gently released the inferior capsule to fully mobilize the subscapularis.  An anterior deltoid retractor was then placed as well as a small Hohmann retractor superiorly.   The glenoid was inspected and had evidence of severe osteoarthritic wear with full-thickness cartilage loss and exposed subchondral bone. The remaining labrum was removed circumferentially taking great care not to disrupt the posterior capsule. The glenoid was sized as listed in the implants above. The center hole was marked and we used a glenoid drill guide to  place a guide pin in the center position.    We planned the case using blueprint technology and plan for an augmented posterior poly-.  We used the reamer set in the technique as described by Tornier to first ream the native glenoid and then reamed the neo-glenoid with the accessory reamer.  We initially had planned for a 25 augment however we felt that a 15 felt better and fit the glenoid better Intra-Op.  We got good fixation with our trials.   We removed the trial components.   The glenoid bone was prepared with pulsatile lavage and a sponge to dry the bone prior to cement applicatio. Cement was mixed on the back table the peripheral peg holes were cemented. The glenoid was impacted securely.   We turned attention back to the humeral side. The cut protector was removed. We trialed with multiple size head options and selected a 43 which re-created the patient's anatomy. The offset was dialed in to match the normal anatomy. The shoulder was trialed.  There was good ROM in all planes and the shoulder was stable with approximately 50% posterior spring back and no inferior translation.  The real humeral implants were opened on the back table and assembled.  The trial was removed. #5 Fiberwire sutures passed through the humeral neck for subscap repair. The humeral component was press-fit obtaining a secure fit.  The joint was reduced and thoroughly irrigated with pulsatile lavage. Subscap and lesser tuberosity osteotomy repaired back in a  double row fashion with #5 Fiberwire sutures through bone tunnels. Next the rotator interval was closed with #2 ethibond suture. Hemostasis was obtained. The deltopectoral interval was reapproximated with #1 Ethibond. The subcutaneous tissues were closed with 2-0 Vicryl and the skin was closed with a running monocryl. The incisions were cleaned and dried and an Aquacel dressing was placed. The drapes taken down. The arm was placed into sling with abduction pillow. Patient  was awakened, extubated, and transferred to the recovery room in stable condition. There were no intraoperative complications. The sponge, needle, and attention counts were correct at the end of the case.    Joya Gaskins, OPA-C, present and scrubbed throughout the case, critical for completion in a timely fashion, and for retraction, instrumentation, closure.

## 2019-02-12 NOTE — Progress Notes (Signed)
AssistedDr. Houser with right, ultrasound guided, interscalene  block. Side rails up, monitors on throughout procedure. See vital signs in flow sheet. Tolerated Procedure well.  

## 2019-02-12 NOTE — Anesthesia Postprocedure Evaluation (Signed)
Anesthesia Post Note  Patient: Reita Chard Louissaint  Procedure(s) Performed: TOTAL SHOULDER ARTHROPLASTY (Right )     Patient location during evaluation: PACU Anesthesia Type: General and Regional Level of consciousness: awake and alert Pain management: pain level controlled Vital Signs Assessment: post-procedure vital signs reviewed and stable Respiratory status: spontaneous breathing, nonlabored ventilation, respiratory function stable and patient connected to nasal cannula oxygen Cardiovascular status: blood pressure returned to baseline and stable Postop Assessment: no apparent nausea or vomiting Anesthetic complications: no    Last Vitals:  Vitals:   02/12/19 1445 02/12/19 1501  BP: (!) 142/95 132/80  Pulse: 64 71  Resp: 15 18  Temp:  36.8 C  SpO2: 100% 96%    Last Pain:  Vitals:   02/12/19 1514  TempSrc:   PainSc: 5                  Barnet Glasgow

## 2019-02-12 NOTE — Anesthesia Procedure Notes (Signed)
Procedure Name: Intubation Date/Time: 02/12/2019 11:37 AM Performed by: Lavina Hamman, CRNA Pre-anesthesia Checklist: Patient identified, Emergency Drugs available, Suction available, Patient being monitored and Timeout performed Patient Re-evaluated:Patient Re-evaluated prior to induction Oxygen Delivery Method: Circle system utilized Preoxygenation: Pre-oxygenation with 100% oxygen Induction Type: IV induction, Rapid sequence and Cricoid Pressure applied Laryngoscope Size: Mac and 3 Grade View: Grade I Tube type: Oral Tube size: 7.0 mm Number of attempts: 1 Airway Equipment and Method: Stylet Placement Confirmation: ETT inserted through vocal cords under direct vision,  positive ETCO2,  CO2 detector and breath sounds checked- equal and bilateral Secured at: 21 cm Tube secured with: Tape Dental Injury: Teeth and Oropharynx as per pre-operative assessment  Comments: ATOI.  RSI for all patients due to possible Covid 19.  Patient asymptomatic.

## 2019-02-12 NOTE — Anesthesia Procedure Notes (Signed)
Anesthesia Regional Block: Interscalene brachial plexus block   Pre-Anesthetic Checklist: ,, timeout performed, Correct Patient, Correct Site, Correct Laterality, Correct Procedure, Correct Position, site marked, Risks and benefits discussed,  Surgical consent,  Pre-op evaluation,  At surgeon's request and post-op pain management  Laterality: Upper and Right  Prep: Maximum Sterile Barrier Precautions used, chloraprep       Needles:  Injection technique: Single-shot  Needle Type: Echogenic Needle     Needle Length: 5cm  Needle Gauge: 21     Additional Needles:   Procedures:,,,, ultrasound used (permanent image in chart),,,,  Narrative:  Start time: 02/12/2019 10:07 AM End time: 02/12/2019 10:16 AM Injection made incrementally with aspirations every 5 mL.  Performed by: Personally  Anesthesiologist: Barnet Glasgow, MD  Additional Notes: Block assessed prior to procedure. Patient tolerated procedure well.

## 2019-02-13 MED ORDER — ONDANSETRON HCL 4 MG PO TABS: 4.0000 mg | ORAL_TABLET | Freq: Three times a day (TID) | ORAL | 1 refills | Status: AC | PRN

## 2019-02-13 MED ORDER — OXYCODONE HCL 5 MG PO TABS: ORAL_TABLET | ORAL | 0 refills | Status: AC

## 2019-02-13 MED ORDER — ACETAMINOPHEN 500 MG PO TABS: 1000.0000 mg | ORAL_TABLET | Freq: Three times a day (TID) | ORAL | 0 refills | Status: AC

## 2019-02-13 NOTE — Discharge Summary (Signed)
Patient ID: Elizabeth Bowen MRN: [id number] DOB/AGE: March 10, 1951 68 y.o.  Admit date: 02/12/2019 Discharge date: 02/13/2019  Admission Diagnoses:R total shoulder arthroplasty  Discharge Diagnoses:  Active Problems:   Degenerative arthritis of right shoulder region   Past Medical History:  Diagnosis Date  . Arthritis   . Bilateral carpal tunnel syndrome   . Coronary artery disease   . Depression   . GERD (gastroesophageal reflux disease)   . History of anemia   . History of gallstones   . History of gastric ulcer   . History of hiatal hernia   . Hypertension   . Hypothyroidism   . 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 LVH (left ventricular hypertrophy) 05/21/2018   noted on EKG  . Morbid obesity ([place])   . OSA on CPAP      Procedures Performed: Right total shoulder arthroplasty  Discharged Condition: good  Hospital Course: Patient brought in as an outpatient for surgery.  Tolerated procedure well.  Was kept for monitoring overnight for pain control and medical monitoring postop and was found to be stable for DC home the morning after surgery.  Patient was instructed on specific activity restrictions and all questions were answered.   Consults: None  Significant Diagnostic Studies: No additional pertinent studies  Treatments: Surgery  Discharge Exam:  Dressing CDI and sling well fitting,  full and painless ROM throughout hand with DPC of 0.  Axillary nerve sensation/motor altered in setting of block and unable to be fully tested.  Distal motor and sensory altered in setting of block.   Disposition: Discharge disposition: 01-Home or Self Care       Discharge Instructions    Call MD for:  persistant nausea and vomiting   Complete by:  As directed    Call MD for:  redness, tenderness, or signs of infection (pain, swelling, redness, odor or green/yellow discharge around incision site)   Complete by:  As directed    Call MD  for:  severe uncontrolled pain   Complete by:  As directed    Diet - low sodium heart healthy   Complete by:  As directed    Discharge instructions   Complete by:  As directed    Ophelia Charter MD, MPH [place]. [address] [phone] (tel)   [phone] (fax)   [place] may leave the operative dressing in place until your follow-up appointment. KEEP THE INCISIONS CLEAN AND DRY. Use the Cryocuff, GameReady or Ice as often as possible for the first 3-4 days, then as needed for pain relief.  You may shower on Post-Op Day #2. The dressing is water resistant but do not scrub it as it may start to peel up.  You may remove the sling for showering, but keep a water resistant pillow under the arm to keep both the elbow and shoulder away from the body (mimicking the abduction sling). Gently pat the area dry. Do not soak the shoulder in water. Do not go swimming in the [place] or ocean until your sutures are removed.  EXERCISES Wear the sling at all times except when doing your exercises. You may remove the sling for showering, but keep the arm across the chest or in a secondary sling.   Accidental/Purposeful External Rotation and shoulder flexion (reaching behind you) is to be avoided at all costs for the first month. Please perform  the exercises:   Elbow / Hand / Wrist  Range of Motion Exercises POST-OP A multi-modal approach will be used to treat your pain. Oxycodone - This is a strong narcotic, to be used only on an "as needed" basis for pain. Meloxicam- An anti-inflammatory medication Acetaminophen - A non-narcotic pain medicine.  Use 1000mg  three times a day for the first 14 days after surgery If you have any adverse effects with the medications, please call our office.  FOLLOW-UP If you develop a Fever (>101.5), Redness or Drainage from the surgical incision site, please call our office  to arrange for an evaluation. Please call the office to schedule a follow-up appointment for a wound check, 7-10 days post-operatively.    IF YOU HAVE ANY QUESTIONS, PLEASE FEEL FREE TO CALL OUR OFFICE.   HELPFUL INFORMATION  Your arm will be in a sling following surgery. You will be in this sling for the next 3-4 weeks.  I will let you know the exact duration at your follow-up visit.  You may be more comfortable sleeping in a semi-seated position the first few nights following surgery.  Keep a pillow propped under the elbow and forearm for comfort.  If you have a recliner type of chair it might be beneficial.  If not that is fine too, but it would be helpful to sleep propped up with pillows behind your operated shoulder as well under your elbow and forearm.  This will reduce pulling on the suture lines.  We suggest you use the pain medication the first night prior to going to bed, in order to ease any pain when the anesthesia wears off. You should avoid taking pain medications on an empty stomach as it will make you nauseous.  Do not drink alcoholic beverages or take illicit drugs when taking pain medications.  In most states it is against the law to drive while your arm is in a sling. And certainly against the law to drive while taking narcotics.  You may return to work/school in the next couple of days when you feel up to it. Desk work and typing in the sling is fine.  When dressing, put your operative arm in the sleeve first.  When getting undressed, take your operative arm out last.  Loose fitting, button-down shirts are recommended.  Pain medication may make you constipated.  Below are a few solutions to try in this order: Decrease the amount of pain medication if you aren't having pain. Drink lots of decaffeinated fluids. Drink prune juice and/or each dried prunes  If the first 3 don't work start with additional solutions Take Colace - an over-the-counter stool softener Take  Senokot - an over-the-counter laxative Take Miralax - a stronger over-the-counter laxative   Increase activity slowly   Complete by:  As directed      Allergies as of 02/13/2019      Reactions   Statins Other (See Comments)   Leg pain    Sulfa Antibiotics Rash   Vioxx [rofecoxib] Rash      Medication List    STOP taking these medications   traMADol 50 MG tablet Commonly known as:  ULTRAM     TAKE these medications   acetaminophen 500 MG tablet Commonly known as:  TYLENOL Take 2 tablets (1,000 mg total) by mouth every 8 (eight) hours for 14 days.   amLODipine 10 MG tablet Commonly known as:  NORVASC Take 10 mg by mouth daily.   aspirin 81 MG tablet Take 81 mg  by mouth daily.   clopidogrel 75 MG tablet Commonly known as:  PLAVIX Take 75 mg by mouth daily.   FLUoxetine 20 MG capsule Commonly known as:  PROZAC Take 40 mg by mouth daily.   levothyroxine 125 MCG tablet Commonly known as:  SYNTHROID, LEVOTHROID Take 125 mcg by mouth daily before breakfast.   metoprolol succinate 25 MG 24 hr tablet Commonly known as:  TOPROL-XL Take 12.5 mg by mouth daily.   ondansetron 4 MG tablet Commonly known as:  Zofran Take 1 tablet (4 mg total) by mouth every 8 (eight) hours as needed for up to 7 days for nausea or vomiting.   oxyCODONE 5 MG immediate release tablet Commonly known as:  Oxy IR/ROXICODONE Take 1-2 pills every 6 hrs as needed for pain, no more than 6 per day   zolpidem 10 MG tablet Commonly known as:  AMBIEN Take 10 mg by mouth at bedtime as needed for sleep.

## 2019-02-13 NOTE — Evaluation (Signed)
Occupational Therapy Evaluation Patient Details Name: Elizabeth Bowen MRN: [id number] DOB: 01-29-1951 Today's Date: 02/13/2019    History of Present Illness s/p R TSA.  H/o HTN, CAD, arthritis   Clinical Impression   This 68 year old female was admitted for the above sx. All education was completed.  Pt will follow up with Dr Griffin Basil     Follow Up Recommendations  Follow surgeon's recommendation for DC plan and follow-up therapies    Equipment Recommendations  None recommended by OT    Recommendations for Other Services       Precautions / Restrictions Precautions Precautions: Shoulder Type of Shoulder Precautions: sling on at all times x bathing/dressing/elbow exercise.  No shoulder ROM, elbow to finger ROM allowed Restrictions Weight Bearing Restrictions: Yes RUE Weight Bearing: Non weight bearing      Mobility Bed Mobility Overal bed mobility: Needs Assistance             General bed mobility comments: min A for OOB with HOB raised  Transfers Overall transfer level: Modified independent                    Balance                                           ADL either performed or assessed with clinical judgement   ADL Overall ADL's : Needs assistance/impaired Eating/Feeding: Set up   Grooming: Set up   Upper Body Bathing: Maximal assistance   Lower Body Bathing: Maximal assistance   Upper Body Dressing : Maximal assistance   Lower Body Dressing: Maximal assistance   Toilet Transfer: Modified Independent   Toileting- Clothing Manipulation and Hygiene: Minimal assistance         General ADL Comments: performed ADL; worked on elbow to finger ROM.  Caregiver donned sling with supervision (abd sling)     Vision         Perception     Praxis      Pertinent Vitals/Pain Pain Assessment: Faces Faces Pain Scale: Hurts a little bit Pain Location: R shoulder Pain Intervention(s): Limited activity within patient's  tolerance;Monitored during session;Premedicated before session;Repositioned     Hand Dominance Right   Extremity/Trunk Assessment Upper Extremity Assessment Upper Extremity Assessment: RUE deficits/detail(immobilized; moves elbow - fingers)           Communication Communication Communication: No difficulties   Cognition Arousal/Alertness: Awake/alert Behavior During Therapy: WFL for tasks assessed/performed Overall Cognitive Status: Within Functional Limits for tasks assessed                                     General Comments  reviewed protocol; pt and caregiver verbalize understanding    Exercises     Shoulder Instructions      Home Living Family/patient expects to be discharged to:: Private residence Living Arrangements: Spouse/significant other Available Help at Discharge: Friend(s)               Bathroom Shower/Tub: Walk-in Psychologist, prison and probation services: Handicapped height         Additional Comments: can put a seat in shower      Prior Functioning/Environment Level of Independence: Independent                 OT Problem List:  OT Treatment/Interventions:      OT Goals(Current goals can be found in the care plan section) Acute Rehab OT Goals Patient Stated Goal: be able to use R UE OT Goal Formulation: All assessment and education complete, DC therapy  OT Frequency:     Barriers to D/C:            Co-evaluation              AM-PAC OT "6 Clicks" Daily Activity     Outcome Measure Help from another person eating meals?: A Little Help from another person taking care of personal grooming?: A Little Help from another person toileting, which includes using toliet, bedpan, or urinal?: A Little Help from another person bathing (including washing, rinsing, drying)?: A Lot Help from another person to put on and taking off regular upper body clothing?: A Lot Help from another person to put on and taking off regular  lower body clothing?: A Lot 6 Click Score: 15   End of Session    Activity Tolerance: Patient tolerated treatment well Patient left: in chair;with call bell/phone within reach;with nursing/sitter in room;with family/visitor present  OT Visit Diagnosis: Pain Pain - Right/Left: Right Pain - part of body: Shoulder                Time: 3893-7342 OT Time Calculation (min): 43 min Charges:  OT General Charges $OT Visit: 1 Visit OT Evaluation $OT Eval Low Complexity: 1 Low OT Treatments $Self Care/Home Management : 23-37 mins  Lesle Chris, OTR/L Acute Rehabilitation Services [phone] [facility] pager [phone] office 02/13/2019  [place] 02/13/2019, 8:39 AM

## 2019-02-18 ENCOUNTER — Encounter (HOSPITAL_COMMUNITY): Payer: Self-pay | Admitting: Orthopaedic Surgery

## 2019-06-18 ENCOUNTER — Other Ambulatory Visit: Payer: Self-pay | Admitting: Surgery

## 2019-06-18 DIAGNOSIS — K9509 Other complications of gastric band procedure: Secondary | ICD-10-CM

## 2019-07-01 ENCOUNTER — Ambulatory Visit
Admission: RE | Admit: 2019-07-01 | Discharge: 2019-07-01 | Disposition: A | Discharge status: Home / Self Care | Payer: Managed Care, Other (non HMO) | Source: Ambulatory Visit | Attending: Surgery | Admitting: Surgery

## 2019-07-01 ENCOUNTER — Other Ambulatory Visit: Payer: Self-pay | Admitting: Surgery

## 2019-07-01 DIAGNOSIS — K9509 Other complications of gastric band procedure: Secondary | ICD-10-CM

## 2019-08-28 IMAGING — RF UPPER GI SERIES (WITHOUT KUB)
7 series · 14 of 24 positions shown · non-contrast
Comparison: Report from 01/24/2007 esophagram (images not
available).

CLINICAL DATA: Remote history of laparoscopic gastric band
reports worsening heartburn.

EXAM:
UPPER GI SERIES WITH KUB
TECHNIQUE: After obtaining a scout radiograph a routine upper GI series was
performed using thin barium.
FLUOROSCOPY TIME:  Fluoroscopy Time:  3 minutes 54 seconds
Radiation Exposure Index (if provided by the fluoroscopic device):
746 mGy
Number of Acquired Spot Images: 10

[Series 1: one shot · 0.14mm/px · 3 of 7 slices shown (1 of 3)]
[im 1/7]
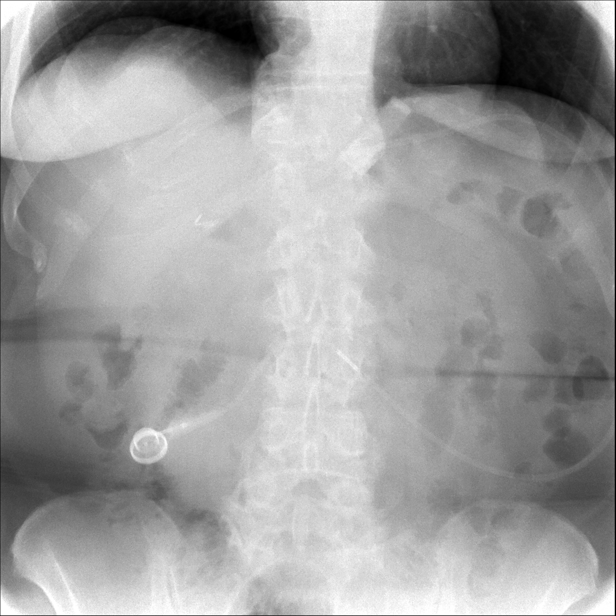
[im 4/7]
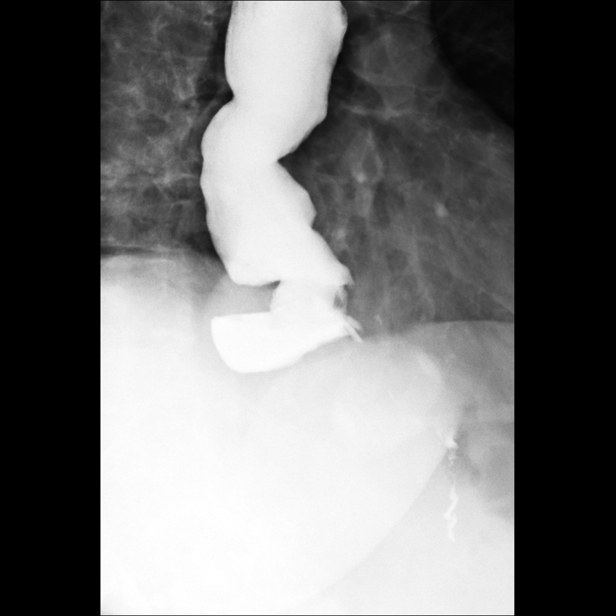
[im 6/7]
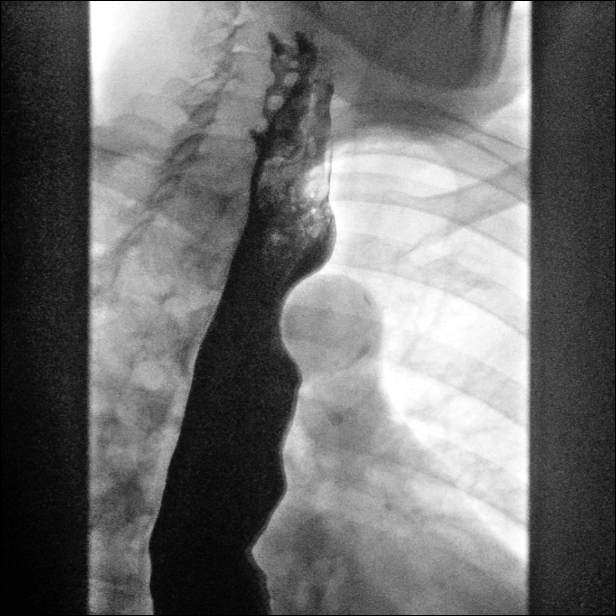

[Series 2: sequence · 2 of 14 frames shown (1 of 4)]
[frame 5/14]
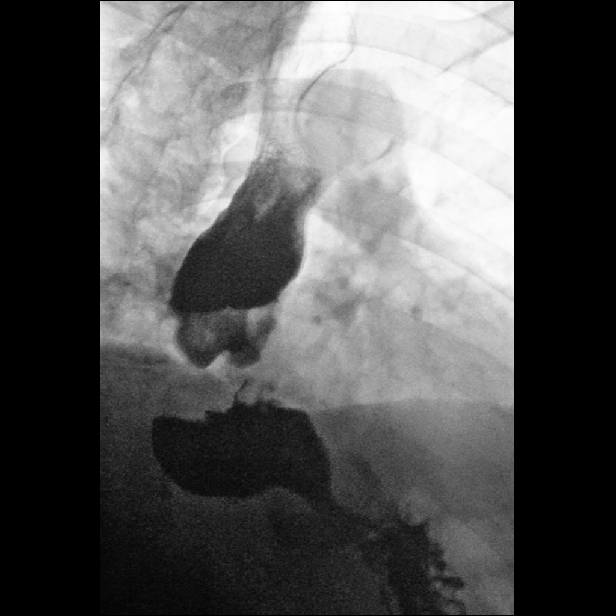
[frame 8/14]
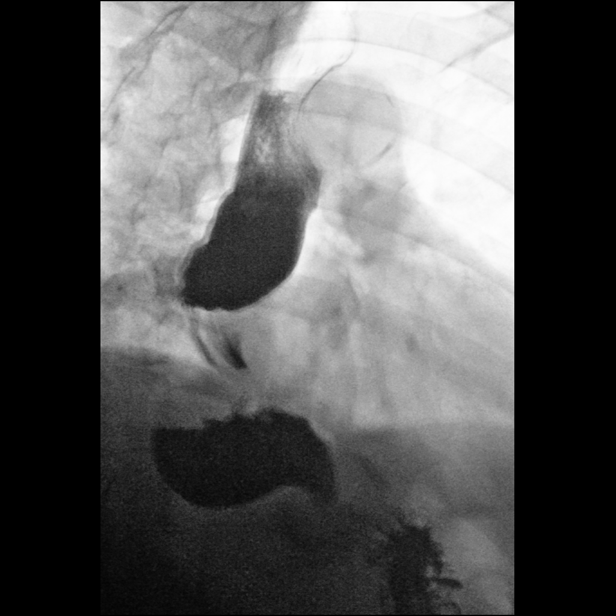

[Series 3: sequence · 1 of 33 frames shown (2 of 4)]
[frame 17/33]
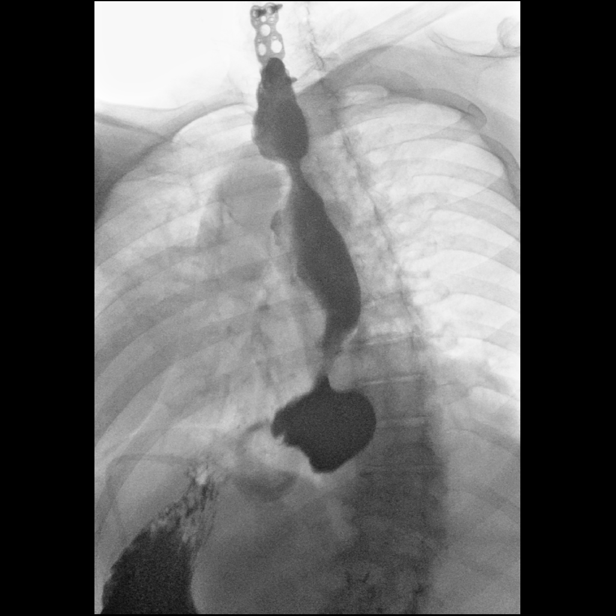

[Series 4: sequence · 2 of 126 frames shown (3 of 4)]
[frame 19/126]
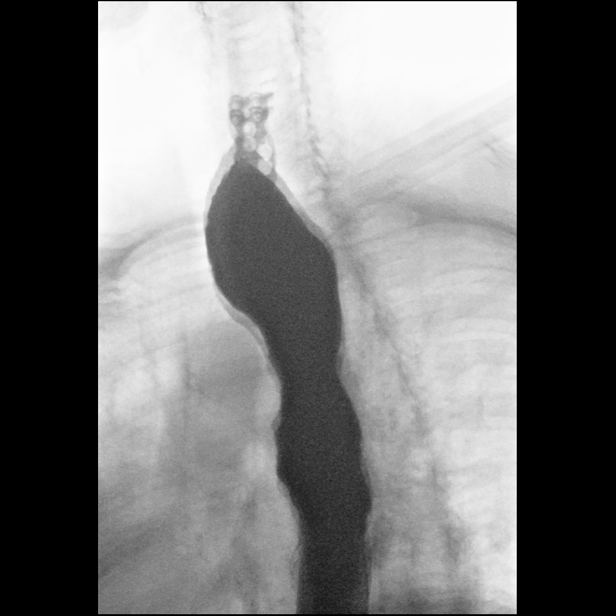
[frame 35/126]
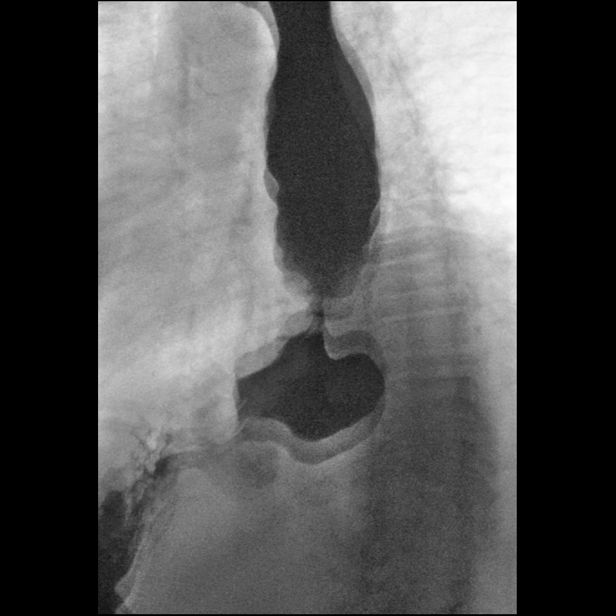

[Series 5: one shot · 0.16mm/px · 4 of 8 slices shown (2 of 3)]
[im 1/8]
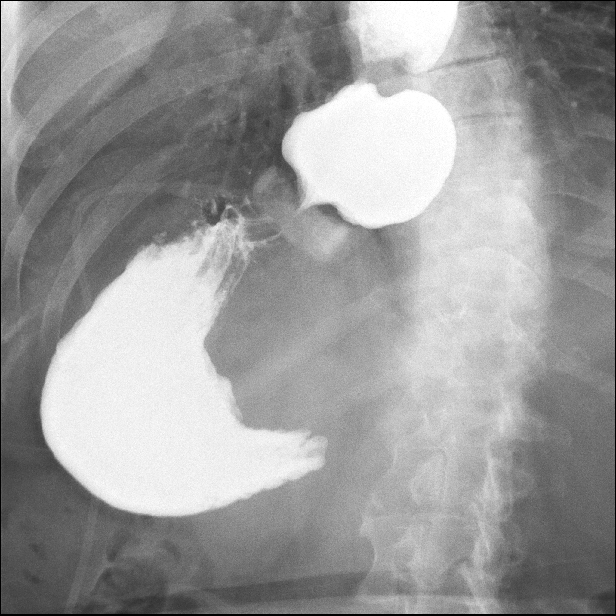
[im 4/8]
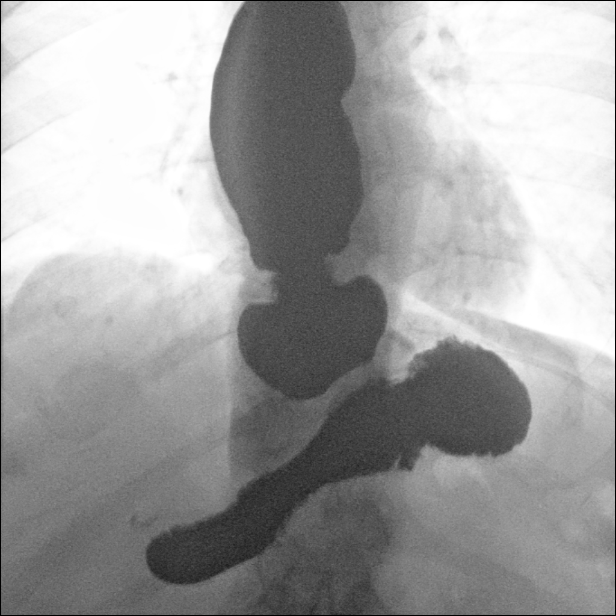
[im 6/8]
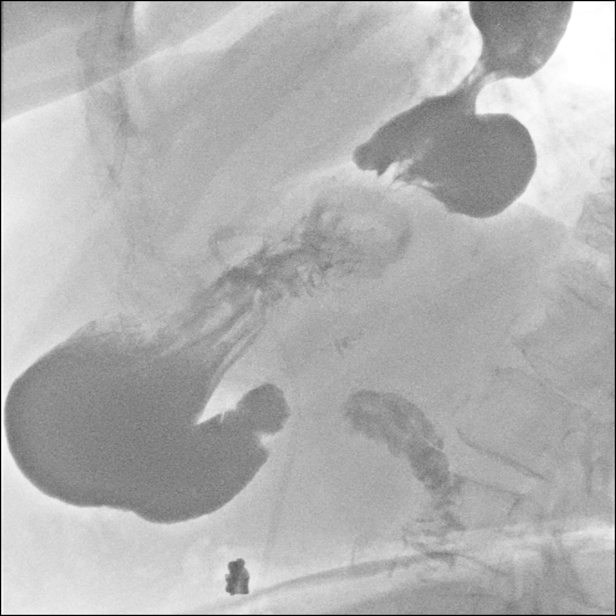
[im 8/8]
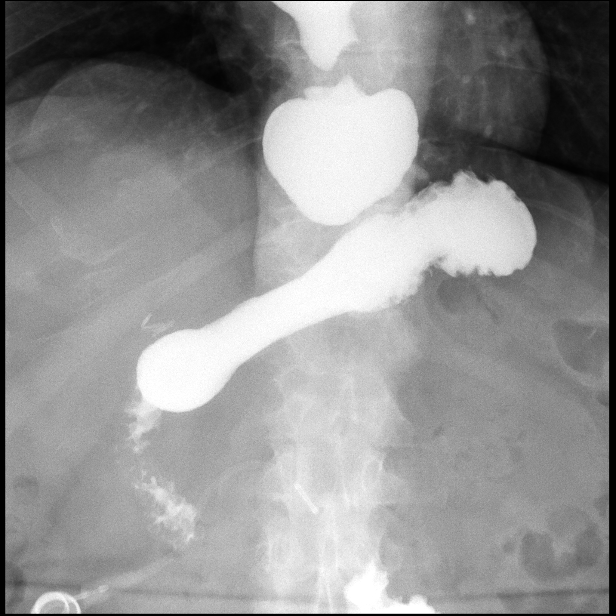

[Series 6: sequence · 1 of 23 frames shown (4 of 4)]
[frame 12/23]
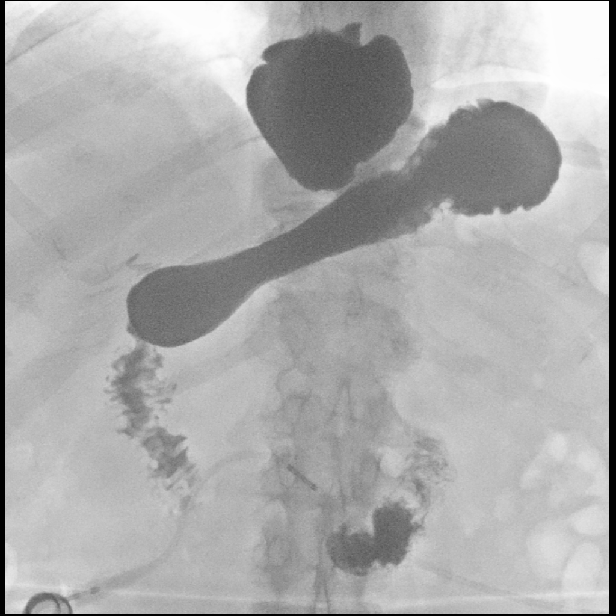

[Series 7: one shot · 0.15mm/px · 1 of 2 slices shown (3 of 3)]
[im 2/2]
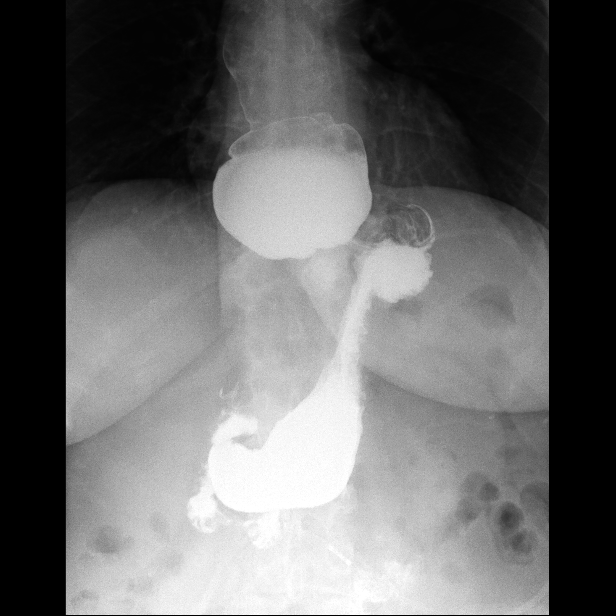

[14 of 24 positions shown; findings below may reference images not displayed]

FINDINGS: Scout radiograph demonstrates expected position of the gastric band
in medial left upper quadrant with intact appearing visualized
tubing to the subcutaneous port in ventral right abdominal wall. No
dilated small bowel loops. No evidence of pneumatosis or
pneumoperitoneum. Clear lung bases. Cholecystectomy clips are seen
in the right upper quadrant of the abdomen. No radiopaque
nephrolithiasis.

Gastric band is in place with the expected orientation in the
gastric cardia. There is a small to moderate fixed hiatal hernia, a
chronic finding back to 11/07/2010 CT abdomen study. There is
moderate to marked smooth narrowing of the gastric lumen by the
gastric band. Diffusely markedly patulous thoracic esophagus.
Prominent esophageal dysmotility, with significantly delayed
esophageal emptying and with prominent tertiary contractions
throughout the thoracic esophagus. No evidence of esophageal mass,
stricture or ulcer. Gastroesophageal reflux could not be assessed
given significantly delayed emptying of the thoracic esophagus. The
stomach is otherwise unremarkable, with no evidence of gastric fold
thickening, filling defects or ulcers. Normal duodenal bulb and C
sweep with no evidence of duodenal fold thickening, filling defects,
strictures or ulcers. Normal position of the duodenal jejunal
junction to the left of the spine. Visualized proximal jejunal loops
are normal caliber and without fold thickening.
IMPRESSION: 1. Chronic small to moderate fixed hiatal hernia, as seen on 5988 CT
abdomen study.
2. Gastric band in place with the expected orientation in the
gastric cardia. Moderate to marked smooth narrowing of the gastric
lumen by the gastric band.
3. Significantly delayed esophageal emptying. Markedly patulous
thoracic esophagus with prominent esophageal dysmotility with
prominent tertiary contractions.

## 2020-06-29 ENCOUNTER — Ambulatory Visit: Payer: Self-pay | Admitting: Physician Assistant

## 2020-06-29 NOTE — H&P (View-Only) (Signed)
cTOTAL KNEE ADMISSION H&P  Patient is being admitted for left total knee arthroplasty.  Subjective:  Chief Complaint:left knee pain.  HPI: Elizabeth Bowen, 69 y.o. female, has a history of pain and functional disability in the left knee due to arthritis and has failed non-surgical conservative treatments for greater than 12 weeks to includeNSAID's and/or analgesics, corticosteriod injections, viscosupplementation injections and activity modification.  Onset of symptoms was gradual, starting 5 years ago with gradually worsening course since that time. The patient noted prior procedures on the knee to include  arthroscopy and menisectomy on the left knee(s).  Patient currently rates pain in the left knee(s) at 8 out of 10 with activity. Patient has night pain, worsening of pain with activity and weight bearing, pain that interferes with activities of daily living and pain with passive range of motion.  Patient has evidence of periarticular osteophytes and joint space narrowing by imaging studies.There is no active infection.  Patient Active Problem List   Diagnosis Date Noted  . Degenerative arthritis of right shoulder region 02/12/2019   Past Medical History:  Diagnosis Date  . Arthritis   . Bilateral carpal tunnel syndrome   . Coronary artery disease   . Depression   . GERD (gastroesophageal reflux disease)   . History of anemia   . History of gallstones   . History of gastric ulcer   . History of hiatal hernia   . Hypertension   . Hypothyroidism   . 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 LVH (left ventricular hypertrophy) 05/21/2018   noted on EKG  . Morbid obesity ([place])   . OSA on CPAP     Past Surgical History:  Procedure Laterality Date  . ABDOMINAL HYSTERECTOMY    . BACK SURGERY     Lumbar disc  . CARPAL TUNNEL RELEASE Bilateral   . CERVICAL FUSION    . CHOLECYSTECTOMY    . COLONOSCOPY    . CORONARY ANGIOPLASTY  2019   Stent placement   . KNEE CARTILAGE SURGERY Bilateral    bilateral  . LAPAROSCOPIC GASTRIC BANDING  02/2014  . TOTAL SHOULDER ARTHROPLASTY Right 02/12/2019   Procedure: TOTAL SHOULDER ARTHROPLASTY;  Surgeon: Hiram Gash, MD;  Location: [facility];  Service: Orthopedics;  Laterality: Right;  . UPPER GI ENDOSCOPY    . WISDOM TOOTH EXTRACTION      Current Outpatient Medications  Medication Sig Dispense Refill Last Dose  . acetaminophen (TYLENOL) 500 MG tablet Take 1,000 mg by mouth every 6 (six) hours as needed (for pain.).     Marland Kitchen amLODipine (NORVASC) 10 MG tablet Take 10 mg by mouth daily.     Marland Kitchen amLODipine (NORVASC) 5 MG tablet Take 5 mg by mouth daily.     Marland Kitchen aspirin EC 81 MG tablet Take 81 mg by mouth daily. Swallow whole.     . clopidogrel (PLAVIX) 75 MG tablet Take 75 mg by mouth daily.     . diclofenac Sodium (VOLTAREN) 1 % GEL Apply 1 application topically 4 (four) times daily as needed for pain.     Marland Kitchen FLUoxetine (PROZAC) 20 MG capsule Take 40 mg by mouth daily.      Marland Kitchen levothyroxine (SYNTHROID, LEVOTHROID) 125 MCG tablet Take 125 mcg by mouth daily before breakfast.      . lisinopril (ZESTRIL) 5 MG tablet Take 5 mg by mouth daily.     . metoprolol succinate (TOPROL-XL) 25 MG 24 hr tablet Take 12.5 mg  by mouth daily.     . pantoprazole (PROTONIX) 40 MG tablet Take 40 mg by mouth daily before breakfast.     . REPATHA SURECLICK 893 MG/ML SOAJ Inject 140 mg into the skin every 14 (fourteen) days.      No current facility-administered medications for this visit.   Allergies  Allergen Reactions  . Statins Other (See Comments)    Leg pain   . Sulfa Antibiotics Rash  . Vioxx [Rofecoxib] Rash    Social History   Tobacco Use  . Smoking status: Former Smoker    Packs/day: 2.00    Types: Cigarettes  . Smokeless tobacco: Never Used  . Tobacco comment: quit 20 years ago, smoked off and on 15 years  Substance Use Topics  . Alcohol use: Not Currently    Family History  Problem Relation Age of Onset  .  Lung cancer Father   . Lung cancer Mother   . Sick sinus syndrome Mother      Review of Systems  Cardiovascular: Positive for chest pain.  Gastrointestinal: Positive for constipation and diarrhea.  Musculoskeletal: Positive for arthralgias.  Hematological: Bruises/bleeds easily.  All other systems reviewed and are negative.   Objective:  Physical Exam Constitutional:      General: She is not in acute distress.    Appearance: Normal appearance.  HENT:     Head: Normocephalic and atraumatic.  Eyes:     Extraocular Movements: Extraocular movements intact.     Pupils: Pupils are equal, round, and reactive to light.  Cardiovascular:     Rate and Rhythm: Normal rate and regular rhythm.     Heart sounds: Murmur heard.   Pulmonary:     Effort: Pulmonary effort is normal. No respiratory distress.     Breath sounds: Normal breath sounds.  Abdominal:     General: Bowel sounds are normal. There is no distension.     Palpations: Abdomen is soft.     Tenderness: There is no abdominal tenderness.  Musculoskeletal:     Cervical back: Normal range of motion and neck supple.     Left knee: Swelling present. No effusion or erythema. Tenderness present over the medial joint line and lateral joint line. No LCL laxity or MCL laxity.Normal pulse.  Lymphadenopathy:     Cervical: No cervical adenopathy.  Skin:    General: Skin is warm and dry.     Findings: No erythema or rash.  Neurological:     General: No focal deficit present.     Mental Status: She is alert and oriented to person, place, and time.  Psychiatric:        Mood and Affect: Mood normal.        Behavior: Behavior normal.     Vital signs in last 24 hours: @VSRANGES @  Labs:   Estimated body mass index is 38.33 kg/m as calculated from the following:   Height as of 02/12/19: 5\' 4"  (1.626 m).   Weight as of 02/12/19: 101.3 kg.   Imaging Review Plain radiographs demonstrate moderate degenerative joint disease of the  left knee(s). The overall alignment ismild varus. The bone quality appears to be good for age and reported activity level.      Assessment/Plan:  End stage arthritis, left knee   The patient history, physical examination, clinical judgment of the provider and imaging studies are consistent with end stage degenerative joint disease of the left knee(s) and total knee arthroplasty is deemed medically necessary. The treatment options including medical management,  injection therapy arthroscopy and arthroplasty were discussed at length. The risks and benefits of total knee arthroplasty were presented and reviewed. The risks due to aseptic loosening, infection, stiffness, patella tracking problems, thromboembolic complications and other imponderables were discussed. The patient acknowledged the explanation, agreed to proceed with the plan and consent was signed. Patient is being admitted for inpatient treatment for surgery, pain control, PT, OT, prophylactic antibiotics, VTE prophylaxis, progressive ambulation and ADL's and discharge planning. The patient is planning to be discharged home with home health services    Anticipated LOS equal to or greater than 2 midnights due to - Age 16 and older with one or more of the following:  - Obesity  - Expected need for hospital services (PT, OT, Nursing) required for safe  discharge  - Anticipated need for postoperative skilled nursing care or inpatient rehab  - Active co-morbidities: Coronary Artery Disease OR   - Unanticipated findings during/Post Surgery: None  - Patient is a high risk of re-admission due to: None

## 2020-06-29 NOTE — H&P (Signed)
cTOTAL KNEE ADMISSION H&P  Patient is being admitted for left total knee arthroplasty.  Subjective:  Chief Complaint:left knee pain.  HPI: Elizabeth Bowen, 69 y.o. female, has a history of pain and functional disability in the left knee due to arthritis and has failed non-surgical conservative treatments for greater than 12 weeks to includeNSAID's and/or analgesics, corticosteriod injections, viscosupplementation injections and activity modification.  Onset of symptoms was gradual, starting 5 years ago with gradually worsening course since that time. The patient noted prior procedures on the knee to include  arthroscopy and menisectomy on the left knee(s).  Patient currently rates pain in the left knee(s) at 8 out of 10 with activity. Patient has night pain, worsening of pain with activity and weight bearing, pain that interferes with activities of daily living and pain with passive range of motion.  Patient has evidence of periarticular osteophytes and joint space narrowing by imaging studies.There is no active infection.  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   Noted on EKG, [facility]   LVH (left ventricular hypertrophy) 05/21/2018   noted on EKG   Morbid obesity (HCC)    OSA on CPAP     Past Surgical History:  Procedure Laterality Date   ABDOMINAL HYSTERECTOMY     BACK SURGERY     Lumbar disc   CARPAL TUNNEL RELEASE Bilateral    CERVICAL FUSION     CHOLECYSTECTOMY     COLONOSCOPY     CORONARY ANGIOPLASTY  2019   Stent placement    KNEE CARTILAGE SURGERY Bilateral    bilateral   LAPAROSCOPIC GASTRIC BANDING  02/2014   TOTAL SHOULDER ARTHROPLASTY Right 02/12/2019   Procedure: TOTAL SHOULDER ARTHROPLASTY;  Surgeon: Hiram Gash, MD;  Location: [facility];  Service: Orthopedics;  Laterality: Right;   UPPER GI ENDOSCOPY     WISDOM TOOTH EXTRACTION      Current Outpatient Medications  Medication Sig Dispense Refill Last Dose   acetaminophen (TYLENOL) 500 MG tablet Take 1,000 mg by mouth every 6 (six) hours as needed (for pain.).      amLODipine (NORVASC) 10 MG tablet Take 10 mg by mouth daily.      amLODipine (NORVASC) 5 MG tablet Take 5 mg by mouth daily.      aspirin EC 81 MG tablet Take 81 mg by mouth daily. Swallow whole.      clopidogrel (PLAVIX) 75 MG tablet Take 75 mg by mouth daily.      diclofenac Sodium (VOLTAREN) 1 % GEL Apply 1 application topically 4 (four) times daily as needed for pain.      FLUoxetine (PROZAC) 20 MG capsule Take 40 mg by mouth daily.       levothyroxine (SYNTHROID, LEVOTHROID) 125 MCG tablet Take 125 mcg by mouth daily before breakfast.       lisinopril (ZESTRIL) 5 MG tablet Take 5 mg by mouth daily.      metoprolol succinate (TOPROL-XL) 25 MG 24 hr tablet Take 12.5 mg  by mouth daily.      pantoprazole (PROTONIX) 40 MG tablet Take 40 mg by mouth daily before breakfast.      REPATHA SURECLICK 810 MG/ML SOAJ Inject 140 mg into the skin every 14 (fourteen) days.      No current facility-administered medications for this visit.   Allergies  Allergen Reactions   Statins Other (See Comments)    Leg pain    Sulfa Antibiotics Rash   Vioxx [Rofecoxib] Rash    Social History   Tobacco Use   Smoking status: Former Smoker    Packs/day: 2.00    Types: Cigarettes   Smokeless tobacco: Never Used   Tobacco comment: quit 20 years ago, smoked off and on 15 years  Substance Use Topics   Alcohol use: Not Currently    Family History  Problem Relation Age of Onset    Lung cancer Father    Lung cancer Mother    Sick sinus syndrome Mother      Review of Systems  Cardiovascular: Positive for chest pain.  Gastrointestinal: Positive for constipation and diarrhea.  Musculoskeletal: Positive for arthralgias.  Hematological: Bruises/bleeds easily.  All other systems reviewed and are negative.   Objective:  Physical Exam Constitutional:      General: She is not in acute distress.    Appearance: Normal appearance.  HENT:     Head: Normocephalic and atraumatic.  Eyes:     Extraocular Movements: Extraocular movements intact.     Pupils: Pupils are equal, round, and reactive to light.  Cardiovascular:     Rate and Rhythm: Normal rate and regular rhythm.     Heart sounds: Murmur heard.   Pulmonary:     Effort: Pulmonary effort is normal. No respiratory distress.     Breath sounds: Normal breath sounds.  Abdominal:     General: Bowel sounds are normal. There is no distension.     Palpations: Abdomen is soft.     Tenderness: There is no abdominal tenderness.  Musculoskeletal:     Cervical back: Normal range of motion and neck supple.     Left knee: Swelling present. No effusion or erythema. Tenderness present over the medial joint line and lateral joint line. No LCL laxity or MCL laxity.Normal pulse.  Lymphadenopathy:     Cervical: No cervical adenopathy.  Skin:    General: Skin is warm and dry.     Findings: No erythema or rash.  Neurological:     General: No focal deficit present.     Mental Status: She is alert and oriented to person, place, and time.  Psychiatric:        Mood and Affect: Mood normal.        Behavior: Behavior normal.     Vital signs in last 24 hours: @VSRANGES @  Labs:   Estimated body mass index is 38.33 kg/m as calculated from the following:   Height as of 02/12/19: 5\' 4"  (1.626 m).   Weight as of 02/12/19: 101.3 kg.   Imaging Review Plain radiographs demonstrate moderate degenerative joint disease of the  left knee(s). The overall alignment ismild varus. The bone quality appears to be good for age and reported activity level.      Assessment/Plan:  End stage arthritis, left knee   The patient history, physical examination, clinical judgment of the provider and imaging studies are consistent with end stage degenerative joint disease of the left knee(s) and total knee arthroplasty is deemed medically necessary. The treatment options including medical management,  injection therapy arthroscopy and arthroplasty were discussed at length. The risks and benefits of total knee arthroplasty were presented and reviewed. The risks due to aseptic loosening, infection, stiffness, patella tracking problems, thromboembolic complications and other imponderables were discussed. The patient acknowledged the explanation, agreed to proceed with the plan and consent was signed. Patient is being admitted for inpatient treatment for surgery, pain control, PT, OT, prophylactic antibiotics, VTE prophylaxis, progressive ambulation and ADL's and discharge planning. The patient is planning to be discharged home with home health services    Anticipated LOS equal to or greater than 2 midnights due to - Age 53 and older with one or more of the following:  - Obesity  - Expected need for hospital services (PT, OT, Nursing) required for safe  discharge  - Anticipated need for postoperative skilled nursing care or inpatient rehab  - Active co-morbidities: Coronary Artery Disease OR   - Unanticipated findings during/Post Surgery: None  - Patient is a high risk of re-admission due to: None

## 2020-07-05 NOTE — Patient Instructions (Addendum)
DUE TO COVID-19 ONLY ONE VISITOR IS ALLOWED TO COME WITH YOU AND STAY IN THE WAITING ROOM ONLY DURING PRE OP AND PROCEDURE DAY OF SURGERY. THE 1 VISITOR  MAY VISIT WITH YOU AFTER SURGERY IN YOUR PRIVATE ROOM DURING VISITING HOURS ONLY!  YOU NEED TO HAVE A COVID 19 TEST ON: 07/13/20  @ 10:00 am , THIS TEST MUST BE DONE BEFORE SURGERY,  COVID TESTING SITE [place] [address], IT IS ON THE RIGHT GOING OUT [address] APPROXIMATELY  2 MINUTES PAST [employer] ON THE RIGHT. ONCE YOUR COVID TEST IS COMPLETED,  PLEASE BEGIN THE QUARANTINE INSTRUCTIONS AS OUTLINED IN YOUR HANDOUT.                [place]   Your procedure is scheduled on: 07/16/20   Report to [facility] Main  Entrance   Report to short stay at: 5:30 AM     Call this number if you have problems the morning of surgery [phone]    Remember:   NO SOLID FOOD AFTER MIDNIGHT THE NIGHT PRIOR TO SURGERY. NOTHING BY MOUTH EXCEPT CLEAR LIQUIDS UNTIL: 4:30 am . PLEASE FINISH ENSURE DRINK PER SURGEON ORDER  WHICH NEEDS TO BE COMPLETED AT : 4:30 am.  CLEAR LIQUID DIET   Foods Allowed                                                                     Foods Excluded  Coffee and tea, regular and decaf                             liquids that you cannot  Plain Jell-O any favor except red or purple                                           see through such as: Fruit ices (not with fruit pulp)                                     milk, soups, orange juice  Iced Popsicles                                    All solid food Carbonated beverages, regular and diet                                    Cranberry, grape and apple juices Sports drinks like Gatorade Lightly seasoned clear broth or consume(fat free) Sugar, honey syrup  Sample Menu Breakfast                                Lunch  Supper Cranberry juice                    Beef broth                             Chicken broth Jell-O                                     Grape juice                           Apple juice Coffee or tea                        Jell-O                                      Popsicle                                                Coffee or tea                        Coffee or tea  _____________________________________________________________________   BRUSH YOUR TEETH MORNING OF SURGERY AND RINSE YOUR MOUTH OUT, NO CHEWING GUM CANDY OR MINTS.     Take these medicines the morning of surgery with A SIP OF WATER: amlodipine,fluoxetine,levothyroxine,metoprolol,pantoprazole.                                You may not have any metal on your body including hair pins and              piercings  Do not wear jewelry, make-up, lotions, powders or perfumes, deodorant             Do not wear nail polish on your fingernails.  Do not shave  48 hours prior to surgery.            Do not bring valuables to the hospital. [place].  Contacts, dentures or bridgework may not be worn into surgery.  Leave suitcase in the car. After surgery it may be brought to your room.     Patients discharged the day of surgery will not be allowed to drive home. IF YOU ARE HAVING SURGERY AND GOING HOME THE SAME DAY, YOU MUST HAVE AN ADULT TO DRIVE YOU HOME AND BE WITH YOU FOR 24 HOURS. YOU MAY GO HOME BY TAXI OR UBER OR ORTHERWISE, BUT AN ADULT MUST ACCOMPANY YOU HOME AND STAY WITH YOU FOR 24 HOURS.  Name and phone number of your driver:  Special Instructions: N/A              Please read over the following fact sheets you were given: _____________________________________________________________________  [facility] - Preparing for Surgery Before surgery, you can play an important role.  Because skin is not sterile, your skin needs to be as free of germs as possible.  You can reduce the number of germs on your skin by washing with CHG (chlorahexidine  gluconate) soap before surgery.  CHG is an antiseptic cleaner which kills germs and bonds with the skin to continue killing germs even after washing. Please DO NOT use if you have an allergy to CHG or antibacterial soaps.  If your skin becomes reddened/irritated stop using the CHG and inform your nurse when you arrive at Short Stay. Do not shave (including legs and underarms) for at least 48 hours prior to the first CHG shower.  You may shave your face/neck. Please follow these instructions carefully:  1.  Shower with CHG Soap the night before surgery and the  morning of Surgery.  2.  If you choose to wash your hair, wash your hair first as usual with your  normal  shampoo.  3.  After you shampoo, rinse your hair and body thoroughly to remove the  shampoo.                           4.  Use CHG as you would any other liquid soap.  You can apply chg directly  to the skin and wash                       Gently with a scrungie or clean washcloth.  5.  Apply the CHG Soap to your body ONLY FROM THE NECK DOWN.   Do not use on face/ open                           Wound or open sores. Avoid contact with eyes, ears mouth and genitals (private parts).                       Wash face,  Genitals (private parts) with your normal soap.             6.  Wash thoroughly, paying special attention to the area where your surgery  will be performed.  7.  Thoroughly rinse your body with warm water from the neck down.  8.  DO NOT shower/wash with your normal soap after using and rinsing off  the CHG Soap.                9.  Pat yourself dry with a clean towel.            10.  Wear clean pajamas.            11.  Place clean sheets on your bed the night of your first shower and do not  sleep with pets. Day of Surgery : Do not apply any lotions/deodorants the morning of surgery.  Please wear clean clothes to the hospital/surgery center.  FAILURE TO FOLLOW THESE INSTRUCTIONS MAY RESULT IN THE CANCELLATION OF YOUR  SURGERY PATIENT SIGNATURE_________________________________  NURSE SIGNATURE__________________________________  ________________________________________________________________________   PLEASE BRING CPAP MASK AND  TUBING ONLY. DEVICE WILL BE PROVIDED!              Incentive Spirometer  An incentive spirometer is a tool that can help keep your lungs clear and active. This tool measures how well you are filling your lungs with each breath. Taking long deep breaths may help reverse or decrease the chance of developing breathing (pulmonary) problems (especially infection) following:  A long period of time when you are unable to move  or be active. BEFORE THE PROCEDURE   If the spirometer includes an indicator to show your best effort, your nurse or respiratory therapist will set it to a desired goal.  If possible, sit up straight or lean slightly forward. Try not to slouch.  Hold the incentive spirometer in an upright position. INSTRUCTIONS FOR USE  1. Sit on the edge of your bed if possible, or sit up as far as you can in bed or on a chair. 2. Hold the incentive spirometer in an upright position. 3. Breathe out normally. 4. Place the mouthpiece in your mouth and seal your lips tightly around it. 5. Breathe in slowly and as deeply as possible, raising the piston or the ball toward the top of the column. 6. Hold your breath for 3-5 seconds or for as long as possible. Allow the piston or ball to fall to the bottom of the column. 7. Remove the mouthpiece from your mouth and breathe out normally. 8. Rest for a few seconds and repeat Steps 1 through 7 at least 10 times every 1-2 hours when you are awake. Take your time and take a few normal breaths between deep breaths. 9. The spirometer may include an indicator to show your best effort. Use the indicator as a goal to work toward during each repetition. 10. After each set of 10 deep breaths, practice coughing to be sure your lungs are  clear. If you have an incision (the cut made at the time of surgery), support your incision when coughing by placing a pillow or rolled up towels firmly against it. Once you are able to get out of bed, walk around indoors and cough well. You may stop using the incentive spirometer when instructed by your caregiver.  RISKS AND COMPLICATIONS  Take your time so you do not get dizzy or light-headed.  If you are in pain, you may need to take or ask for pain medication before doing incentive spirometry. It is harder to take a deep breath if you are having pain. AFTER USE  Rest and breathe slowly and easily.  It can be helpful to keep track of a log of your progress. Your caregiver can provide you with a simple table to help with this. If you are using the spirometer at home, follow these instructions: [place] IF:   You are having difficultly using the spirometer.  You have trouble using the spirometer as often as instructed.  Your pain medication is not giving enough relief while using the spirometer.  You develop fever of 100.5 F (38.1 C) or higher. SEEK IMMEDIATE MEDICAL CARE IF:   You cough up bloody sputum that had not been present before.  You develop fever of 102 F (38.9 C) or greater.  You develop worsening pain at or near the incision site. MAKE SURE YOU:   Understand these instructions.  Will watch your condition.  Will get help right away if you are not doing well or get worse. Document Released: 03/26/2007 Document Revised: 02/05/2012 Document Reviewed: 05/27/2007 [facility] Patient Information 2014 [place], [place].   ________________________________________________________________________

## 2020-07-06 ENCOUNTER — Encounter (HOSPITAL_COMMUNITY): Payer: Self-pay

## 2020-07-06 ENCOUNTER — Other Ambulatory Visit: Payer: Self-pay

## 2020-07-06 ENCOUNTER — Encounter (HOSPITAL_COMMUNITY)
Admission: RE | Admit: 2020-07-06 | Discharge: 2020-07-06 | Disposition: A | Discharge status: Home / Self Care | Payer: Medicare Other | Source: Ambulatory Visit | Attending: Orthopedic Surgery | Admitting: Orthopedic Surgery

## 2020-07-06 DIAGNOSIS — I444 Left anterior fascicular block: Secondary | ICD-10-CM | POA: Insufficient documentation

## 2020-07-06 DIAGNOSIS — Z79899 Other long term (current) drug therapy: Secondary | ICD-10-CM | POA: Diagnosis not present

## 2020-07-06 DIAGNOSIS — Z9049 Acquired absence of other specified parts of digestive tract: Secondary | ICD-10-CM | POA: Diagnosis not present

## 2020-07-06 DIAGNOSIS — Z79890 Hormone replacement therapy (postmenopausal): Secondary | ICD-10-CM | POA: Insufficient documentation

## 2020-07-06 DIAGNOSIS — Z01818 Encounter for other preprocedural examination: Secondary | ICD-10-CM | POA: Diagnosis not present

## 2020-07-06 DIAGNOSIS — Z8711 Personal history of peptic ulcer disease: Secondary | ICD-10-CM | POA: Diagnosis not present

## 2020-07-06 DIAGNOSIS — Z6838 Body mass index (BMI) 38.0-38.9, adult: Secondary | ICD-10-CM | POA: Diagnosis not present

## 2020-07-06 DIAGNOSIS — I2510 Atherosclerotic heart disease of native coronary artery without angina pectoris: Secondary | ICD-10-CM | POA: Diagnosis not present

## 2020-07-06 DIAGNOSIS — F329 Major depressive disorder, single episode, unspecified: Secondary | ICD-10-CM | POA: Diagnosis not present

## 2020-07-06 DIAGNOSIS — M1712 Unilateral primary osteoarthritis, left knee: Secondary | ICD-10-CM | POA: Diagnosis not present

## 2020-07-06 DIAGNOSIS — Z87891 Personal history of nicotine dependence: Secondary | ICD-10-CM | POA: Insufficient documentation

## 2020-07-06 DIAGNOSIS — Z955 Presence of coronary angioplasty implant and graft: Secondary | ICD-10-CM | POA: Insufficient documentation

## 2020-07-06 DIAGNOSIS — Z791 Long term (current) use of non-steroidal anti-inflammatories (NSAID): Secondary | ICD-10-CM | POA: Diagnosis not present

## 2020-07-06 DIAGNOSIS — E039 Hypothyroidism, unspecified: Secondary | ICD-10-CM | POA: Insufficient documentation

## 2020-07-06 DIAGNOSIS — G4733 Obstructive sleep apnea (adult) (pediatric): Secondary | ICD-10-CM | POA: Diagnosis not present

## 2020-07-06 DIAGNOSIS — Z85828 Personal history of other malignant neoplasm of skin: Secondary | ICD-10-CM | POA: Diagnosis not present

## 2020-07-06 DIAGNOSIS — I10 Essential (primary) hypertension: Secondary | ICD-10-CM | POA: Insufficient documentation

## 2020-07-06 DIAGNOSIS — Z7982 Long term (current) use of aspirin: Secondary | ICD-10-CM | POA: Diagnosis not present

## 2020-07-06 DIAGNOSIS — K219 Gastro-esophageal reflux disease without esophagitis: Secondary | ICD-10-CM | POA: Diagnosis not present

## 2020-07-06 HISTORY — DX: Malignant (primary) neoplasm, unspecified: C80.1

## 2020-07-06 LAB — CBC WITH DIFFERENTIAL/PLATELET
Abs Immature Granulocytes: 0.04 10*3/uL (ref 0.00–0.07)
Basophils Absolute: 0 10*3/uL (ref 0.0–0.1)
Basophils Relative: 1 %
Eosinophils Absolute: 0.3 10*3/uL (ref 0.0–0.5)
Eosinophils Relative: 5 %
HCT: 40.6 % (ref 36.0–46.0)
Hemoglobin: 12.7 g/dL (ref 12.0–15.0)
Immature Granulocytes: 1 %
Lymphocytes Relative: 25 %
Lymphs Abs: 1.7 10*3/uL (ref 0.7–4.0)
MCH: 29.6 pg (ref 26.0–34.0)
MCHC: 31.3 g/dL (ref 30.0–36.0)
MCV: 94.6 fL (ref 80.0–100.0)
Monocytes Absolute: 0.6 10*3/uL (ref 0.1–1.0)
Monocytes Relative: 8 %
Neutro Abs: 4.3 10*3/uL (ref 1.7–7.7)
Neutrophils Relative %: 60 %
Platelets: 249 10*3/uL (ref 150–400)
RBC: 4.29 MIL/uL (ref 3.87–5.11)
RDW: 13.9 % (ref 11.5–15.5)
WBC: 6.9 10*3/uL (ref 4.0–10.5)
nRBC: 0 % (ref 0.0–0.2)

## 2020-07-06 LAB — COMPREHENSIVE METABOLIC PANEL
ALT: 18 U/L (ref 0–44)
AST: 16 U/L (ref 15–41)
Albumin: 4.2 g/dL (ref 3.5–5.0)
Alkaline Phosphatase: 61 U/L (ref 38–126)
Anion gap: 9 (ref 5–15)
BUN: 21 mg/dL (ref 8–23)
CO2: 27 mmol/L (ref 22–32)
Calcium: 9.2 mg/dL (ref 8.9–10.3)
Chloride: 104 mmol/L (ref 98–111)
Creatinine, Ser: 0.69 mg/dL (ref 0.44–1.00)
GFR calc Af Amer: >60 mL/min (ref >60)
GFR calc non Af Amer: >60 mL/min (ref >60)
Glucose, Bld: 97 mg/dL (ref 70–99)
Potassium: 4.5 mmol/L (ref 3.5–5.1)
Sodium: 140 mmol/L (ref 135–145)
Total Bilirubin: 0.5 mg/dL (ref 0.3–1.2)
Total Protein: 7.4 g/dL (ref 6.5–8.1)

## 2020-07-06 LAB — URINALYSIS, ROUTINE W REFLEX MICROSCOPIC
Bilirubin Urine: NEGATIVE
Glucose, UA: NEGATIVE mg/dL
Hgb urine dipstick: NEGATIVE
Ketones, ur: NEGATIVE mg/dL
Leukocytes,Ua: NEGATIVE
Nitrite: NEGATIVE
Protein, ur: NEGATIVE mg/dL
Specific Gravity, Urine: 1.03 (ref 1.005–1.030)
pH: 5 (ref 5.0–8.0)

## 2020-07-06 LAB — APTT: aPTT: 25 seconds (ref 24–36)

## 2020-07-06 LAB — PROTIME-INR
INR: 1 (ref 0.8–1.2)
Prothrombin Time: 12.9 seconds (ref 11.4–15.2)

## 2020-07-06 LAB — SURGICAL PCR SCREEN
MRSA, PCR: NEGATIVE
Staphylococcus aureus: NEGATIVE

## 2020-07-06 NOTE — Progress Notes (Signed)
COVID Vaccine Completed:Yes Date COVID Vaccine completed:01/06/20 COVID vaccine manufacturer: Pfizer    *[employer]   PCP - Dr. Joaquim Lai. Clearance 06/24/20: Chart Cardiologist - Dr. Berniece Pap. :Clearance: 06/24/20: Chart  Chest x-ray -  EKG -  Stress Test -  ECHO -  Cardiac Cath -   Sleep Study - yes CPAP - yes  Fasting Blood Sugar -  Checks Blood Sugar _____ times a day  Blood Thinner Instructions: Hold Plavix and aspirin 5 days before surgery as per Dr. Humphrey Rolls Aspirin Instructions: Last Dose:  Anesthesia review: Hx: HTN,OSA(CPAP),CAD(stent),dysrhythmia.  Patient denies shortness of breath, fever, cough and chest pain at PAT appointment   Patient verbalized understanding of instructions that were given to them at the PAT appointment. Patient was also instructed that they will need to review over the PAT instructions again at home before surgery.

## 2020-07-07 LAB — URINE CULTURE

## 2020-07-07 NOTE — Progress Notes (Signed)
Anesthesia Chart Review   Case: [id number] Date/Time: 07/16/20 0715   Procedure: TOTAL KNEE ARTHROPLASTY (Left Knee)   Anesthesia type: Choice   Pre-op diagnosis: OA LEFT KNEE   [facility] RO[facility] 05 / [facility]   Surgeons: Earlie Server, MD      DISCUSSION:69 y.o. former smoker with h/o HTN, GERD, OSA on CPAP, CAD (stent 12/2017), s/p lab band 02/2014, left knee OA scheduled for above procedure 07/16/2020 with Dr. Earlie Server.   Clearance from PCP on chart which states pt is optimized for surgery from a medical standpoint.   Clearance from cardiologist, Dr. Humphrey Rolls, on chart which states pt is optimized for surgery from cardiac standpoint.  "May stop Plavix 5 days prior to surgery and re-start post-op asap when ok with surgeon.  Do not stop ASA."  Anticipate pt can proceed with planned procedure barring acute status change.   VS: BP (!) 141/68   Pulse (!) 46   Temp 36.4 C (Oral)   Resp 16   Ht 5\' 4"  (1.626 m)   Wt 101.2 kg   SpO2 100%   BMI 38.31 kg/m   PROVIDERS: Joseph Art, MD is PCP   Berniece Pap, MD is Cardiologist  LABS: Labs reviewed: Acceptable for surgery. (all labs ordered are listed, but only abnormal results are displayed)  Labs Reviewed  URINE CULTURE - Abnormal; Notable for the following components:      Result Value   Culture MULTIPLE SPECIES PRESENT, SUGGEST RECOLLECTION (*)    All other components within normal limits  SURGICAL PCR SCREEN  APTT  CBC WITH DIFFERENTIAL/PLATELET  COMPREHENSIVE METABOLIC PANEL  PROTIME-INR  URINALYSIS, ROUTINE W REFLEX MICROSCOPIC  TYPE AND SCREEN     IMAGES:   EKG: 07/06/2020 Rate 44 bpm Marked sinus bradycardia Left axis deviation Moderate voltage criteria for LVH, may be normal variant ( R in aVL , Cornell product ) Cannot rule out Septal infarct , age undetermined Abnormal ECG No previous tracing   CV: Cardiac Cath 05/22/18 (on chart) Summary:  1. Normal left ventricular systolic function with mild  anterior wall hypokinesia.  2. 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 Drug-eluting synergy 2.5 x 24 stent to the mid left anterior descending artery.  Recommendations:  1. Enteric-coated aspirin 81mg  p.o. daily long term 2. Plavix 75mg  p.o. daily long term 3. Statins long-term Past Medical History:  Diagnosis Date  . Arthritis   . Bilateral carpal tunnel syndrome   . Cancer ([place])    skin  . Coronary artery disease   . Depression   . GERD (gastroesophageal reflux disease)   . History of anemia   . History of gallstones   . History of gastric ulcer   . History of hiatal hernia   . Hypertension   . Hypothyroidism   . 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 LVH (left ventricular hypertrophy) 05/21/2018   noted on EKG  . Morbid obesity ([place])   . OSA on CPAP     Past Surgical History:  Procedure Laterality Date  . ABDOMINAL HYSTERECTOMY    . BACK SURGERY     Lumbar disc  . CARPAL TUNNEL RELEASE Bilateral   . CERVICAL FUSION    . CHOLECYSTECTOMY    . COLONOSCOPY    . CORONARY ANGIOPLASTY  2019   Stent placement  . KNEE CARTILAGE SURGERY Bilateral    bilateral  . LAPAROSCOPIC GASTRIC BANDING  02/2014  . TOTAL SHOULDER ARTHROPLASTY  Right 02/12/2019   Procedure: TOTAL SHOULDER ARTHROPLASTY;  Surgeon: Hiram Gash, MD;  Location[facility];  Service: Orthopedics;  Laterality: Right;  . UPPER GI ENDOSCOPY    . WISDOM TOOTH EXTRACTION      MEDICATIONS: . acetaminophen (TYLENOL) 500 MG tablet  . amLODipine (NORVASC) 10 MG tablet  . amLODipine (NORVASC) 5 MG tablet  . aspirin EC 81 MG tablet  . clopidogrel (PLAVIX) 75 MG tablet  . diclofenac Sodium (VOLTAREN) 1 % GEL  . FLUoxetine (PROZAC) 20 MG capsule  . levothyroxine (SYNTHROID, LEVOTHROID) 125 MCG tablet  . lisinopril (ZESTRIL) 5 MG tablet  . metoprolol succinate (TOPROL-XL) 25 MG 24 hr tablet  . pantoprazole (PROTONIX) 40 MG tablet  . REPATHA SURECLICK 642 MG/ML SOAJ    No current facility-administered medications for this encounter.     Konrad Felix, PA-C [facility] [phone]

## 2020-07-07 NOTE — Anesthesia Preprocedure Evaluation (Addendum)
Anesthesia Evaluation  Patient identified by MRN, date of birth, ID band Patient awake    Reviewed: Allergy & Precautions, NPO status , Patient's Chart, lab work & pertinent test results  Airway Mallampati: II  TM Distance: >3 FB Neck ROM: Full    Dental  (+) Teeth Intact   Pulmonary former smoker,    breath sounds clear to auscultation       Cardiovascular hypertension,  Rhythm:Regular Rate:Normal     Neuro/Psych    GI/Hepatic   Endo/Other    Renal/GU      Musculoskeletal   Abdominal   Peds  Hematology   Anesthesia Other Findings   Reproductive/Obstetrics                            Anesthesia Physical Anesthesia Plan  ASA: III  Anesthesia Plan: General   Post-op Pain Management:  [facility]ional for Post-op pain   Induction: Intravenous  PONV Risk Score and Plan: Ondansetron and Dexamethasone  Airway Management Planned: Oral ETT  Additional Equipment:   Intra-op Plan:   Post-operative Plan: Extubation in OR  Informed Consent: I have reviewed the patients History and Physical, chart, labs and discussed the procedure including the risks, benefits and alternatives for the proposed anesthesia with the patient or authorized representative who has indicated his/her understanding and acceptance.       Plan Discussed with: CRNA and Anesthesiologist  Anesthesia Plan Comments: (See PAT note 07/06/2020, Konrad Felix, PA-C)       Anesthesia Quick Evaluation

## 2020-07-08 NOTE — Progress Notes (Signed)
Urine culture results: multiple species present,suggest recollection.

## 2020-07-13 ENCOUNTER — Other Ambulatory Visit (HOSPITAL_COMMUNITY)
Admission: RE | Admit: 2020-07-13 | Discharge: 2020-07-13 | Disposition: A | Discharge status: Home / Self Care | Payer: Medicare Other | Source: Ambulatory Visit | Attending: Orthopedic Surgery | Admitting: Orthopedic Surgery

## 2020-07-13 DIAGNOSIS — Z01812 Encounter for preprocedural laboratory examination: Secondary | ICD-10-CM | POA: Insufficient documentation

## 2020-07-13 DIAGNOSIS — Z20822 Contact with and (suspected) exposure to covid-19: Secondary | ICD-10-CM | POA: Diagnosis not present

## 2020-07-13 LAB — SARS CORONAVIRUS 2 (TAT 6-24 HRS): SARS Coronavirus 2: NEGATIVE

## 2020-07-15 MED ORDER — TRANEXAMIC ACID 1000 MG/10ML IV SOLN
2000.0000 mg | INTRAVENOUS | Status: DC
  Filled 2020-07-15: qty 20

## 2020-07-15 MED ORDER — BUPIVACAINE LIPOSOME 1.3 % IJ SUSP
20.0000 mL | Freq: Once | INTRAMUSCULAR | Status: DC
  Filled 2020-07-15: qty 20

## 2020-07-16 ENCOUNTER — Encounter (HOSPITAL_COMMUNITY): Payer: Self-pay | Admitting: Orthopedic Surgery

## 2020-07-16 ENCOUNTER — Ambulatory Visit (HOSPITAL_COMMUNITY): Payer: Medicare Other | Admitting: Physician Assistant

## 2020-07-16 ENCOUNTER — Other Ambulatory Visit: Payer: Self-pay

## 2020-07-16 ENCOUNTER — Ambulatory Visit (HOSPITAL_COMMUNITY): Payer: Medicare Other | Admitting: Certified Registered Nurse Anesthetist

## 2020-07-16 ENCOUNTER — Observation Stay (HOSPITAL_COMMUNITY)
Admission: RE | Admit: 2020-07-16 | Discharge: 2020-07-17 | Disposition: A | Discharge status: Home / Self Care | Payer: Medicare Other | Source: Other Acute Inpatient Hospital | Attending: Orthopedic Surgery | Admitting: Orthopedic Surgery

## 2020-07-16 ENCOUNTER — Encounter (HOSPITAL_COMMUNITY)
Admission: RE | Disposition: A | Discharge status: Home / Self Care | Payer: Self-pay | Source: Other Acute Inpatient Hospital | Attending: Orthopedic Surgery

## 2020-07-16 DIAGNOSIS — I2510 Atherosclerotic heart disease of native coronary artery without angina pectoris: Secondary | ICD-10-CM | POA: Insufficient documentation

## 2020-07-16 DIAGNOSIS — I10 Essential (primary) hypertension: Secondary | ICD-10-CM | POA: Diagnosis not present

## 2020-07-16 DIAGNOSIS — M1712 Unilateral primary osteoarthritis, left knee: Principal | ICD-10-CM | POA: Diagnosis present

## 2020-07-16 DIAGNOSIS — E039 Hypothyroidism, unspecified: Secondary | ICD-10-CM | POA: Insufficient documentation

## 2020-07-16 DIAGNOSIS — Z79899 Other long term (current) drug therapy: Secondary | ICD-10-CM | POA: Diagnosis not present

## 2020-07-16 DIAGNOSIS — Z96652 Presence of left artificial knee joint: Secondary | ICD-10-CM

## 2020-07-16 DIAGNOSIS — M25562 Pain in left knee: Secondary | ICD-10-CM | POA: Diagnosis present

## 2020-07-16 HISTORY — PX: TOTAL KNEE ARTHROPLASTY: SHX125

## 2020-07-16 LAB — ABO/RH: ABO/RH(D): B POS

## 2020-07-16 LAB — TYPE AND SCREEN
ABO/RH(D): B POS
Antibody Screen: NEGATIVE

## 2020-07-16 SURGERY — ARTHROPLASTY, KNEE, TOTAL
Anesthesia: General | Site: Knee | Laterality: Left

## 2020-07-16 MED ORDER — ONDANSETRON HCL 4 MG/2ML IJ SOLN
INTRAMUSCULAR | Status: AC
  Filled 2020-07-16: qty 2

## 2020-07-16 MED ORDER — SODIUM CHLORIDE 0.9 % IV SOLN: INTRAVENOUS | Status: DC

## 2020-07-16 MED ORDER — EPHEDRINE 5 MG/ML INJ
INTRAVENOUS | Status: AC
  Filled 2020-07-16: qty 10

## 2020-07-16 MED ORDER — MENTHOL 3 MG MT LOZG: 1.0000 | LOZENGE | OROMUCOSAL | Status: DC | PRN

## 2020-07-16 MED ORDER — DEXAMETHASONE SODIUM PHOSPHATE 10 MG/ML IJ SOLN
INTRAMUSCULAR | Status: DC | PRN
  Administered 2020-07-16: 5 mg via INTRAVENOUS

## 2020-07-16 MED ORDER — LEVOTHYROXINE SODIUM 125 MCG PO TABS
125.0000 ug | ORAL_TABLET | Freq: Every day | ORAL | Status: DC
  Administered 2020-07-17: 125 ug via ORAL
  Filled 2020-07-16: qty 1

## 2020-07-16 MED ORDER — CHLORHEXIDINE GLUCONATE 0.12 % MT SOLN
15.0000 mL | Freq: Once | OROMUCOSAL | Status: AC
  Administered 2020-07-16: 15 mL via OROMUCOSAL

## 2020-07-16 MED ORDER — ASPIRIN EC 81 MG PO TBEC: 81.0000 mg | DELAYED_RELEASE_TABLET | Freq: Two times a day (BID) | ORAL | 11 refills | Status: AC

## 2020-07-16 MED ORDER — OXYCODONE HCL 5 MG/5ML PO SOLN: 5.0000 mg | Freq: Once | ORAL | Status: DC | PRN

## 2020-07-16 MED ORDER — ASPIRIN 81 MG PO CHEW
81.0000 mg | CHEWABLE_TABLET | Freq: Two times a day (BID) | ORAL | Status: DC
  Administered 2020-07-16 – 2020-07-17 (×2): 81 mg via ORAL
  Filled 2020-07-16 (×2): qty 1

## 2020-07-16 MED ORDER — ONDANSETRON HCL 4 MG/2ML IJ SOLN
INTRAMUSCULAR | Status: DC | PRN
  Administered 2020-07-16: 4 mg via INTRAVENOUS

## 2020-07-16 MED ORDER — OXYCODONE HCL 5 MG PO TABS: ORAL_TABLET | ORAL | 0 refills | Status: DC

## 2020-07-16 MED ORDER — HYDROMORPHONE HCL 1 MG/ML IJ SOLN
0.5000 mg | INTRAMUSCULAR | Status: DC | PRN
  Administered 2020-07-16: 1 mg via INTRAVENOUS
  Administered 2020-07-16: 0.5 mg via INTRAVENOUS
  Administered 2020-07-16 – 2020-07-17 (×3): 1 mg via INTRAVENOUS
  Filled 2020-07-16 (×4): qty 1

## 2020-07-16 MED ORDER — METOCLOPRAMIDE HCL 5 MG/ML IJ SOLN: 5.0000 mg | Freq: Three times a day (TID) | INTRAMUSCULAR | Status: DC | PRN

## 2020-07-16 MED ORDER — FLEET ENEMA 7-19 GM/118ML RE ENEM: 1.0000 | ENEMA | Freq: Once | RECTAL | Status: DC | PRN

## 2020-07-16 MED ORDER — CEFAZOLIN SODIUM-DEXTROSE 2-4 GM/100ML-% IV SOLN
2.0000 g | Freq: Four times a day (QID) | INTRAVENOUS | Status: AC
  Administered 2020-07-16 (×2): 2 g via INTRAVENOUS
  Filled 2020-07-16 (×3): qty 100

## 2020-07-16 MED ORDER — LACTATED RINGERS IV SOLN: INTRAVENOUS | Status: DC

## 2020-07-16 MED ORDER — ONDANSETRON HCL 4 MG PO TABS: 4.0000 mg | ORAL_TABLET | Freq: Four times a day (QID) | ORAL | Status: DC | PRN

## 2020-07-16 MED ORDER — FENTANYL CITRATE (PF) 100 MCG/2ML IJ SOLN
INTRAMUSCULAR | Status: AC
  Filled 2020-07-16: qty 2

## 2020-07-16 MED ORDER — ONDANSETRON HCL 4 MG/2ML IJ SOLN: 4.0000 mg | Freq: Once | INTRAMUSCULAR | Status: DC | PRN

## 2020-07-16 MED ORDER — DEXAMETHASONE SODIUM PHOSPHATE 10 MG/ML IJ SOLN
INTRAMUSCULAR | Status: AC
  Filled 2020-07-16: qty 1

## 2020-07-16 MED ORDER — AMLODIPINE BESYLATE 5 MG PO TABS
5.0000 mg | ORAL_TABLET | Freq: Every day | ORAL | Status: DC
  Filled 2020-07-16: qty 1

## 2020-07-16 MED ORDER — SENNOSIDES-DOCUSATE SODIUM 8.6-50 MG PO TABS: 1.0000 | ORAL_TABLET | Freq: Every evening | ORAL | Status: DC | PRN

## 2020-07-16 MED ORDER — PHENYLEPHRINE 40 MCG/ML (10ML) SYRINGE FOR IV PUSH (FOR BLOOD PRESSURE SUPPORT)
PREFILLED_SYRINGE | INTRAVENOUS | Status: AC
  Filled 2020-07-16: qty 10

## 2020-07-16 MED ORDER — OXYCODONE HCL 5 MG PO TABS: 5.0000 mg | ORAL_TABLET | Freq: Once | ORAL | Status: DC | PRN

## 2020-07-16 MED ORDER — SORBITOL 70 % SOLN
30.0000 mL | Freq: Every day | Status: DC | PRN
  Filled 2020-07-16: qty 30

## 2020-07-16 MED ORDER — TRANEXAMIC ACID 1000 MG/10ML IV SOLN
INTRAVENOUS | Status: DC | PRN
  Administered 2020-07-16: 2000 mg via TOPICAL

## 2020-07-16 MED ORDER — OXYCODONE HCL 5 MG PO TABS
5.0000 mg | ORAL_TABLET | ORAL | Status: DC | PRN
  Administered 2020-07-16: 10 mg via ORAL
  Administered 2020-07-16: 5 mg via ORAL
  Administered 2020-07-17 (×2): 10 mg via ORAL
  Filled 2020-07-16: qty 1
  Filled 2020-07-16 (×3): qty 2

## 2020-07-16 MED ORDER — PHENYLEPHRINE 40 MCG/ML (10ML) SYRINGE FOR IV PUSH (FOR BLOOD PRESSURE SUPPORT)
PREFILLED_SYRINGE | INTRAVENOUS | Status: DC | PRN
  Administered 2020-07-16: 80 ug via INTRAVENOUS
  Administered 2020-07-16: 40 ug via INTRAVENOUS
  Administered 2020-07-16: 120 ug via INTRAVENOUS

## 2020-07-16 MED ORDER — METOPROLOL SUCCINATE ER 25 MG PO TB24
12.5000 mg | ORAL_TABLET | Freq: Every day | ORAL | Status: DC
  Administered 2020-07-17: 12.5 mg via ORAL
  Filled 2020-07-16: qty 1

## 2020-07-16 MED ORDER — BUPIVACAINE-EPINEPHRINE (PF) 0.25% -1:200000 IJ SOLN
INTRAMUSCULAR | Status: AC
  Filled 2020-07-16: qty 30

## 2020-07-16 MED ORDER — ORAL CARE MOUTH RINSE: 15.0000 mL | Freq: Once | OROMUCOSAL | Status: AC

## 2020-07-16 MED ORDER — SODIUM CHLORIDE 0.9 % IV SOLN
INTRAVENOUS | Status: DC
  Administered 2020-07-16: 1000 mL via INTRAVENOUS

## 2020-07-16 MED ORDER — TRANEXAMIC ACID-NACL 1000-0.7 MG/100ML-% IV SOLN
INTRAVENOUS | Status: AC
  Administered 2020-07-16: 1000 mg via INTRAVENOUS
  Filled 2020-07-16: qty 100

## 2020-07-16 MED ORDER — FENTANYL CITRATE (PF) 100 MCG/2ML IJ SOLN: 25.0000 ug | INTRAMUSCULAR | Status: DC | PRN

## 2020-07-16 MED ORDER — PROPOFOL 500 MG/50ML IV EMUL
INTRAVENOUS | Status: DC | PRN
  Administered 2020-07-16: 75 ug/kg/min via INTRAVENOUS

## 2020-07-16 MED ORDER — CEFAZOLIN SODIUM-DEXTROSE 2-4 GM/100ML-% IV SOLN
2.0000 g | INTRAVENOUS | Status: AC
  Administered 2020-07-16: 2 g via INTRAVENOUS
  Filled 2020-07-16: qty 100

## 2020-07-16 MED ORDER — MIDAZOLAM HCL 5 MG/5ML IJ SOLN
INTRAMUSCULAR | Status: DC | PRN
  Administered 2020-07-16 (×2): 1 mg via INTRAVENOUS

## 2020-07-16 MED ORDER — FLUOXETINE HCL 20 MG PO CAPS
40.0000 mg | ORAL_CAPSULE | Freq: Every day | ORAL | Status: DC
  Administered 2020-07-17: 40 mg via ORAL
  Filled 2020-07-16: qty 2

## 2020-07-16 MED ORDER — ACETAMINOPHEN 500 MG PO TABS
1000.0000 mg | ORAL_TABLET | Freq: Four times a day (QID) | ORAL | Status: DC | PRN
  Administered 2020-07-16 – 2020-07-17 (×2): 1000 mg via ORAL
  Filled 2020-07-16 (×2): qty 2

## 2020-07-16 MED ORDER — MIDAZOLAM HCL 2 MG/2ML IJ SOLN
INTRAMUSCULAR | Status: AC
  Filled 2020-07-16: qty 2

## 2020-07-16 MED ORDER — DIPHENHYDRAMINE HCL 12.5 MG/5ML PO ELIX: 12.5000 mg | ORAL_SOLUTION | ORAL | Status: DC | PRN

## 2020-07-16 MED ORDER — SODIUM CHLORIDE 0.9 % IV SOLN
INTRAVENOUS | Status: AC | PRN
  Administered 2020-07-16: 1000 mL

## 2020-07-16 MED ORDER — PROPOFOL 10 MG/ML IV BOLUS
INTRAVENOUS | Status: DC | PRN
  Administered 2020-07-16: 100 mg via INTRAVENOUS
  Administered 2020-07-16: 20 mg via INTRAVENOUS

## 2020-07-16 MED ORDER — SODIUM CHLORIDE 0.9% FLUSH
INTRAVENOUS | Status: DC | PRN
  Administered 2020-07-16: 50 mL

## 2020-07-16 MED ORDER — LISINOPRIL 5 MG PO TABS
5.0000 mg | ORAL_TABLET | Freq: Every day | ORAL | Status: DC
  Administered 2020-07-17: 5 mg via ORAL
  Filled 2020-07-16: qty 1

## 2020-07-16 MED ORDER — BUPIVACAINE HCL (PF) 0.75 % IJ SOLN
INTRAMUSCULAR | Status: DC | PRN
  Administered 2020-07-16: 1.8 mL via INTRATHECAL

## 2020-07-16 MED ORDER — EPHEDRINE SULFATE-NACL 50-0.9 MG/10ML-% IV SOSY
PREFILLED_SYRINGE | INTRAVENOUS | Status: DC | PRN
  Administered 2020-07-16 (×2): 5 mg via INTRAVENOUS

## 2020-07-16 MED ORDER — METOCLOPRAMIDE HCL 5 MG PO TABS: 5.0000 mg | ORAL_TABLET | Freq: Three times a day (TID) | ORAL | Status: DC | PRN

## 2020-07-16 MED ORDER — HYDROMORPHONE HCL 1 MG/ML IJ SOLN
INTRAMUSCULAR | Status: AC
  Administered 2020-07-16: 0.5 mg via INTRAVENOUS
  Filled 2020-07-16: qty 1

## 2020-07-16 MED ORDER — BUPIVACAINE-EPINEPHRINE (PF) 0.25% -1:200000 IJ SOLN
INTRAMUSCULAR | Status: DC | PRN
  Administered 2020-07-16: 30 mL

## 2020-07-16 MED ORDER — ONDANSETRON HCL 4 MG/2ML IJ SOLN: 4.0000 mg | Freq: Four times a day (QID) | INTRAMUSCULAR | Status: DC | PRN

## 2020-07-16 MED ORDER — TRANEXAMIC ACID-NACL 1000-0.7 MG/100ML-% IV SOLN
1000.0000 mg | INTRAVENOUS | Status: AC
  Administered 2020-07-16: 1000 mg via INTRAVENOUS
  Filled 2020-07-16: qty 100

## 2020-07-16 MED ORDER — AMLODIPINE BESYLATE 10 MG PO TABS
10.0000 mg | ORAL_TABLET | Freq: Every day | ORAL | Status: DC
  Administered 2020-07-17: 10 mg via ORAL
  Filled 2020-07-16: qty 1

## 2020-07-16 MED ORDER — PHENOL 1.4 % MT LIQD
1.0000 | OROMUCOSAL | Status: DC | PRN
  Filled 2020-07-16: qty 177

## 2020-07-16 MED ORDER — SODIUM CHLORIDE 0.9 % IR SOLN
Status: DC | PRN
  Administered 2020-07-16: 1000 mL

## 2020-07-16 MED ORDER — SODIUM CHLORIDE (PF) 0.9 % IJ SOLN
INTRAMUSCULAR | Status: AC
  Filled 2020-07-16: qty 50

## 2020-07-16 MED ORDER — DOCUSATE SODIUM 100 MG PO CAPS
100.0000 mg | ORAL_CAPSULE | Freq: Two times a day (BID) | ORAL | Status: DC
  Administered 2020-07-16 – 2020-07-17 (×2): 100 mg via ORAL
  Filled 2020-07-16 (×2): qty 1

## 2020-07-16 MED ORDER — TRANEXAMIC ACID-NACL 1000-0.7 MG/100ML-% IV SOLN: 1000.0000 mg | Freq: Once | INTRAVENOUS | Status: AC

## 2020-07-16 MED ORDER — CLOPIDOGREL BISULFATE 75 MG PO TABS
75.0000 mg | ORAL_TABLET | Freq: Every day | ORAL | Status: DC
  Administered 2020-07-17: 75 mg via ORAL
  Filled 2020-07-16: qty 1

## 2020-07-16 MED ORDER — FENTANYL CITRATE (PF) 100 MCG/2ML IJ SOLN
INTRAMUSCULAR | Status: DC | PRN
Start: 2020-07-16 — End: 2020-07-16
  Administered 2020-07-16 (×3): 50 ug via INTRAVENOUS

## 2020-07-16 MED ORDER — POVIDONE-IODINE 10 % EX SWAB
2.0000 "application " | Freq: Once | CUTANEOUS | Status: AC
  Administered 2020-07-16: 2 via TOPICAL

## 2020-07-16 MED ORDER — BUPIVACAINE LIPOSOME 1.3 % IJ SUSP
INTRAMUSCULAR | Status: DC | PRN
  Administered 2020-07-16: 20 mL

## 2020-07-16 MED ORDER — PANTOPRAZOLE SODIUM 40 MG PO TBEC
40.0000 mg | DELAYED_RELEASE_TABLET | Freq: Every day | ORAL | Status: DC
  Administered 2020-07-17: 40 mg via ORAL
  Filled 2020-07-16: qty 1

## 2020-07-16 SURGICAL SUPPLY — 64 items
ATTUNE PS FEM LT SZ 6 CEM KNEE (Femur) ×3 IMPLANT
ATTUNE PSRP INSR SZ6 7 KNEE (Insert) ×2 IMPLANT
ATTUNE PSRP INSR SZ6 7MM KNEE (Insert) ×1 IMPLANT
BAG DECANTER FOR FLEXI CONT (MISCELLANEOUS) ×3 IMPLANT
BAG ZIPLOCK 12X15 (MISCELLANEOUS) ×3 IMPLANT
BASE TIBIAL ROT PLAT SZ 5 KNEE (Knees) ×1 IMPLANT
BENZOIN TINCTURE PRP APPL 2/3 (GAUZE/BANDAGES/DRESSINGS) ×3 IMPLANT
BLADE SAGITTAL 25.0X1.19X90 (BLADE) ×2 IMPLANT
BLADE SAGITTAL 25.0X1.19X90MM (BLADE) ×1
BLADE SAW SGTL 13X75X1.27 (BLADE) ×3 IMPLANT
BLADE SURG 15 STRL LF DISP TIS (BLADE) ×1 IMPLANT
BLADE SURG 15 STRL SS (BLADE) ×3
BLADE SURG SZ10 CARB STEEL (BLADE) ×3 IMPLANT
BNDG ELASTIC 6X15 VLCR STRL LF (GAUZE/BANDAGES/DRESSINGS) ×3 IMPLANT
BOWL SMART MIX CTS (DISPOSABLE) ×3 IMPLANT
CEMENT HV SMART SET (Cement) ×6 IMPLANT
CLOSURE STERI-STRIP 1/2X4 (GAUZE/BANDAGES/DRESSINGS) ×2
CLOSURE WOUND 1/2 X4 (GAUZE/BANDAGES/DRESSINGS) ×1
CLSR STERI-STRIP ANTIMIC 1/2X4 (GAUZE/BANDAGES/DRESSINGS) ×4 IMPLANT
COVER SURGICAL LIGHT HANDLE (MISCELLANEOUS) ×3 IMPLANT
COVER WAND RF STERILE (DRAPES) ×3 IMPLANT
CUFF TOURN SGL QUICK 34 (TOURNIQUET CUFF) ×3
CUFF TRNQT CYL 34X4.125X (TOURNIQUET CUFF) ×1 IMPLANT
DECANTER SPIKE VIAL GLASS SM (MISCELLANEOUS) ×6 IMPLANT
DRAPE U-SHAPE 47X51 STRL (DRAPES) ×3 IMPLANT
DRESSING AQUACEL AG SP 3.5X10 (GAUZE/BANDAGES/DRESSINGS) ×1 IMPLANT
DRSG AQUACEL AG SP 3.5X10 (GAUZE/BANDAGES/DRESSINGS) ×3
DURAPREP 26ML APPLICATOR (WOUND CARE) ×6 IMPLANT
ELECT REM PT RETURN 15FT ADLT (MISCELLANEOUS) ×3 IMPLANT
GLOVE BIOGEL PI IND STRL 8 (GLOVE) ×2 IMPLANT
GLOVE BIOGEL PI INDICATOR 8 (GLOVE) ×4
GLOVE SURG ORTHO 8.0 STRL STRW (GLOVE) ×3 IMPLANT
GLOVE SURG SS PI 7.5 STRL IVOR (GLOVE) ×3 IMPLANT
GOWN STRL REUS W/TWL XL LVL3 (GOWN DISPOSABLE) ×6 IMPLANT
HANDPIECE INTERPULSE COAX TIP (DISPOSABLE) ×3
HOLDER FOLEY CATH W/STRAP (MISCELLANEOUS) ×3 IMPLANT
HOOD PEEL AWAY FLYTE STAYCOOL (MISCELLANEOUS) ×3 IMPLANT
IMMOBILIZER KNEE 20 (SOFTGOODS) ×3
IMMOBILIZER KNEE 20 THIGH 36 (SOFTGOODS) ×1 IMPLANT
KIT TURNOVER KIT A (KITS) ×3 IMPLANT
MANIFOLD NEPTUNE II (INSTRUMENTS) ×3 IMPLANT
NEEDLE HYPO 22GX1.5 SAFETY (NEEDLE) ×6 IMPLANT
NS IRRIG 1000ML POUR BTL (IV SOLUTION) ×3 IMPLANT
PACK ICE MAXI GEL EZY WRAP (MISCELLANEOUS) ×3 IMPLANT
PACK TOTAL KNEE CUSTOM (KITS) ×3 IMPLANT
PATELLA MEDIAL ATTUN 35MM KNEE (Knees) ×3 IMPLANT
PENCIL SMOKE EVACUATOR (MISCELLANEOUS) ×3 IMPLANT
PIN DRILL FIX HALF THREAD (BIT) ×3 IMPLANT
PIN STEINMAN FIXATION KNEE (PIN) ×3 IMPLANT
PROTECTOR NERVE ULNAR (MISCELLANEOUS) ×3 IMPLANT
SET HNDPC FAN SPRY TIP SCT (DISPOSABLE) ×1 IMPLANT
STRIP CLOSURE SKIN 1/2X4 (GAUZE/BANDAGES/DRESSINGS) ×2 IMPLANT
SUT ETHIBOND NAB CT1 #1 30IN (SUTURE) ×6 IMPLANT
SUT MNCRL AB 3-0 PS2 18 (SUTURE) ×3 IMPLANT
SUT VIC AB 0 CT1 36 (SUTURE) ×3 IMPLANT
SUT VIC AB 2-0 CT1 27 (SUTURE) ×6
SUT VIC AB 2-0 CT1 TAPERPNT 27 (SUTURE) ×2 IMPLANT
SYR CONTROL 10ML LL (SYRINGE) ×9 IMPLANT
TIBIAL BASE ROT PLAT SZ 5 KNEE (Knees) ×3 IMPLANT
TRAY FOLEY MTR SLVR 16FR STAT (SET/KITS/TRAYS/PACK) ×3 IMPLANT
WATER STERILE IRR 1000ML POUR (IV SOLUTION) ×6 IMPLANT
WRAP KNEE MAXI GEL POST OP (GAUZE/BANDAGES/DRESSINGS) ×3 IMPLANT
YANKAUER SUCT BULB TIP 10FT TU (MISCELLANEOUS) ×3 IMPLANT
YANKAUER SUCT BULB TIP NO VENT (SUCTIONS) ×3 IMPLANT

## 2020-07-16 NOTE — TOC Initial Note (Addendum)
Transition of Care [facility]) - Initial/Assessment Note    Patient Details  Name: Elizabeth Bowen MRN: [id number] Date of Birth: 1951-07-29  Transition of Care [facility]) CM/SW Contact:    Trish Mage, LCSW Phone Number: 07/16/2020, 2:26 PM  Clinical Narrative:  Patient seen in follow up to MD order for [facility] PT.  Ms Durocher lives by herself in [place], [place] in [place]. She has all needed DME, and will have a friend stay with her at D/C.  She tells me her preference of agencies is [facility].  I called Gail at [facility], and she took the referral.  Asked that I FAX info to them at [phone]. Information faxed. TOC will continue to follow during the course of hospitalization.                  Expected Discharge Plan: [place] Barriers to Discharge: No Barriers Identified   Patient Goals and CMS Choice   CMS Medicare.gov Compare Post Acute Care list provided to:: Patient Choice offered to / list presented to : Patient  Expected Discharge Plan and Services Expected Discharge Plan: [place]   Discharge Planning Services: CM Consult Post Acute Care Choice: [place] arrangements for the past 2 months: Single Family Home Expected Discharge Date: 07/17/20                                    Prior Living Arrangements/Services Living arrangements for the past 2 months: Single Family Home Lives with:: Self Patient language and need for interpreter reviewed:: Yes        Need for Family Participation in Patient Care: Yes (Comment) Care giver support system in place?: Yes (comment)   Criminal Activity/Legal Involvement Pertinent to Current Situation/Hospitalization: No - Comment as needed  Activities of Daily Living      Permission Sought/Granted                  Emotional Assessment Appearance:: Appears stated age Attitude/Demeanor/Rapport: Engaged Affect (typically observed): Appropriate Orientation: : Oriented to Self, Oriented  to Place, Oriented to  Time, Oriented to Situation Alcohol / Substance Use: Not Applicable Psych Involvement: No (comment)  Admission diagnosis:  Degenerative joint disease of left knee [M17.12] S/P total knee arthroplasty, left [O97.353] Patient Active Problem List   Diagnosis Date Noted  . Degenerative joint disease of left knee 07/16/2020  . S/P total knee arthroplasty, left 07/16/2020  . Degenerative arthritis of right shoulder region 02/12/2019   PCP:  Joseph Art, MD Pharmacy:   [facility] [place] [address] [place] [address] Phone: [phone] Fax: [phone]  [id number] - [place], [place] [address] [place] [address] Phone: [phone] Fax: [phone]     Social Determinants of Health (SDOH) Interventions    Readmission Risk Interventions No flowsheet data found.

## 2020-07-16 NOTE — Evaluation (Signed)
Physical Therapy Evaluation Patient Details Name: Elizabeth Bowen MRN: [id number] DOB: [birth year] Today's Date: 07/16/2020   History of Present Illness  Patient is 69 y.o. female s/p Lt TKA  on 07/16/20 with PMH significant for OSA, LVH, HTN, hypothyroidism, GERD, CAD, OA, depression, Rt TSA, bil CTR, and lubar surgery. Patient developed atrial fibrillation at a rate of 80-90 with stable blood pressure.  This lasted approximately 45 minutes and she subsequently converted to sinus rhythm.  She emerged from anesthesia without difficulty and has remained in sinus rhythm in the recovery room and denies palpitations shortness of breath chest pain or any other cardiac symptoms.    Clinical Impression  Elizabeth Bowen is a 69 y.o. female POD 0 s/p Lt TKA. Patient reports independence with mobility at baseline. Patient is now limited by functional impairments (see PT problem list below) and requires min assist for transfers and gait with RW. Patient was able to ambulate ~75 feet with RW and min assist. Patient instructed in exercise to facilitate ROM and circulation. Patient will benefit from continued skilled PT interventions to address impairments and progress towards PLOF. Acute PT will follow to progress mobility and stair training in preparation for safe discharge home.     Follow Up Recommendations Follow surgeon's recommendation for DC plan and follow-up therapies;Home health PT    Equipment Recommendations  None recommended by PT    Recommendations for Other Services       Precautions / Restrictions Precautions Precautions: Fall Restrictions Weight Bearing Restrictions: No Other Position/Activity Restrictions: WBAT      Mobility  Bed Mobility Overal bed mobility: Needs Assistance Bed Mobility: Supine to Sit     Supine to sit: Min assist;HOB elevated     General bed mobility comments: light assist for bringing LE's off EOB and to sit up and cues to use bed rail.   Transfers Overall  transfer level: Needs assistance Equipment used: Rolling walker (2 wheeled) Transfers: Sit to/from Stand Sit to Stand: Min assist         General transfer comment: cues for technique with RW, min assist to fully rise, pt steady in standing.   Ambulation/Gait Ambulation/Gait assistance: Min assist Gait Distance (Feet): 75 Feet Assistive device: Rolling walker (2 wheeled) Gait Pattern/deviations: Step-to pattern;Decreased stride length;Decreased weight shift to left Gait velocity: decr   General Gait Details: cues for step pattern and proximity to RW, no overt LOB or buckling at Lt knee noted.  Stairs     Wheelchair Mobility    Modified Rankin (Stroke Patients Only)       Balance Overall balance assessment: Needs assistance Sitting-balance support: Feet supported Sitting balance-Leahy Scale: Good     Standing balance support: During functional activity;Bilateral upper extremity supported Standing balance-Leahy Scale: Fair                Pertinent Vitals/Pain Pain Assessment: 0-10 Pain Score: 2  Pain Location: Lt knee Pain Descriptors / Indicators: Aching;Discomfort Pain Intervention(s): Limited activity within patient's tolerance;Monitored during session;Premedicated before session;Repositioned;Ice applied    Home Living Family/patient expects to be discharged to:: Private residence Living Arrangements: Spouse/significant other Available Help at Discharge: Family Type of Home: House Home Access: [place]: One [place]: Environmental consultant - 2 wheels;Walker - 4 wheels;Cane - single point;Bedside commode;Grab bars - tub/shower;Grab bars - toilet      Prior Function Level of Independence: Independent         Comments: pt enjoys gardening, glass  fusing, home repair.     Hand Dominance   Dominant Hand: Right    Extremity/Trunk Assessment   Upper Extremity Assessment Upper Extremity Assessment: Overall WFL for tasks  assessed    Lower Extremity Assessment Lower Extremity Assessment: Overall WFL for tasks assessed;LLE deficits/detail LLE Deficits / Details: good quad activation, no extensor lag with SLR LLE Sensation: WNL LLE Coordination: WNL    Cervical / Trunk Assessment Cervical / Trunk Assessment: Normal  Communication   Communication: No difficulties  Cognition Arousal/Alertness: Awake/alert Behavior During Therapy: WFL for tasks assessed/performed Overall Cognitive Status: Within Functional Limits for tasks assessed           General Comments      Exercises Total Joint Exercises Ankle Circles/Pumps: AROM;Both;20 reps;Seated Quad Sets: AROM;Left;10 reps;Seated Heel Slides: AROM;Left;10 reps;Supine   Assessment/Plan    PT Assessment Patient needs continued PT services  PT Problem List Decreased strength;Decreased range of motion;Decreased activity tolerance;Decreased balance;Decreased mobility;Decreased knowledge of use of DME;Pain       PT Treatment Interventions DME instruction;Gait training;Stair training;Functional mobility training;Therapeutic activities;Therapeutic exercise;Balance training;Patient/family education    PT Goals (Current goals can be found in the Care Plan section)  Acute Rehab PT Goals Patient Stated Goal: get back to independence and stop having knee pain PT Goal Formulation: With patient Time For Goal Achievement: 07/23/20 Potential to Achieve Goals: Good    Frequency 7X/week   Barriers to discharge           AM-PAC PT "6 Clicks" Mobility  Outcome Measure Help needed turning from your back to your side while in a flat bed without using bedrails?: A Little Help needed moving from lying on your back to sitting on the side of a flat bed without using bedrails?: A Little Help needed moving to and from a bed to a chair (including a wheelchair)?: A Little Help needed standing up from a chair using your arms (e.g., wheelchair or bedside chair)?: A  Little Help needed to walk in hospital room?: A Little Help needed climbing 3-5 steps with a railing? : A Little 6 Click Score: 18    End of Session Equipment Utilized During Treatment: Gait belt Activity Tolerance: Patient tolerated treatment well Patient left: in chair;with call bell/phone within reach;with chair alarm set;with family/visitor present Nurse Communication: Mobility status PT Visit Diagnosis: Muscle weakness (generalized) (M62.81);Difficulty in walking, not elsewhere classified (R26.2);Pain Pain - Right/Left: Left Pain - part of body: Knee    Time: 5027-7412 PT Time Calculation (min) (ACUTE ONLY): 34 min   Charges:   PT Evaluation $PT Eval Low Complexity: 1 Low PT Treatments $Therapeutic Exercise: 8-22 mins        Verner Mould, DPT Acute Rehabilitation Services  Office [phone] Pager [phone]  07/16/2020 5:52 PM

## 2020-07-16 NOTE — Anesthesia Procedure Notes (Addendum)
Anesthesia Regional Block: Adductor canal block   Pre-Anesthetic Checklist: ,, timeout performed, Correct Patient, Correct Site, Correct Laterality, Correct Procedure, Correct Position, site marked, Risks and benefits discussed,  Surgical consent,  Pre-op evaluation,  At surgeon's request and post-op pain management  Laterality: Left  Prep: chloraprep       Needles:  Injection technique: Single-shot  Needle Type: Stimulator Needle - 40      Needle Gauge: 22     Additional Needles:   Procedures:, nerve stimulator,,,,,,,  Narrative:  Start time: 07/16/2020 6:45 AM End time: 07/16/2020 6:55 AM Injection made incrementally with aspirations every 5 mL.  Performed by: Personally  Anesthesiologist: Roberts Gaudy, MD  Additional Notes: 20 cc 0.5% Bupivacaine with 1:200 epi 10 cc 1.3% Exparel

## 2020-07-16 NOTE — Anesthesia Procedure Notes (Addendum)
Procedure Name: LMA Insertion Date/Time: 07/16/2020 8:15 AM Performed by: Claudia Desanctis, CRNA Pre-anesthesia Checklist: Emergency Drugs available, Patient identified, Suction available and Patient being monitored Patient Re-evaluated:Patient Re-evaluated prior to induction Oxygen Delivery Method: Circle system utilized Preoxygenation: Pre-oxygenation with 100% oxygen Induction Type: IV induction Ventilation: Mask ventilation without difficulty LMA: LMA inserted LMA Size: 4.0 Number of attempts: 1 Placement Confirmation: positive ETCO2 and breath sounds checked- equal and bilateral Tube secured with: Tape Dental Injury: Teeth and Oropharynx as per pre-operative assessment

## 2020-07-16 NOTE — Discharge Instructions (Signed)

## 2020-07-16 NOTE — Anesthesia Procedure Notes (Addendum)
Spinal  Patient location during procedure: OR Start time: 07/16/2020 7:40 AM End time: 07/16/2020 7:44 AM Staffing Performed: resident/CRNA  Resident/CRNA: Claudia Desanctis, CRNA Preanesthetic Checklist Completed: patient identified, IV checked, site marked, risks and benefits discussed, surgical consent, monitors and equipment checked, pre-op evaluation and timeout performed Spinal Block Patient position: sitting Prep: DuraPrep Patient monitoring: heart rate, cardiac monitor, continuous pulse ox and blood pressure Approach: midline Location: L3-4 Injection technique: single-shot Needle Needle type: Pencan  Needle gauge: 24 G Needle length: 10 cm Needle insertion depth: 9 cm Assessment Sensory level: T4 Additional Notes Single stick, csf at beginning and end of injection

## 2020-07-16 NOTE — Progress Notes (Signed)
Pt. placed on CPAP @ this time, humidified filled with S/W, on R/A tolerating well, made aware to notify if needed.

## 2020-07-16 NOTE — Progress Notes (Signed)
Orthopedic Tech Progress Note Patient Details:  Elizabeth Bowen 12/22/1950 [id number]  CPM Left Knee CPM Left Knee: On Left Knee Flexion (Degrees): 90 Left Knee Extension (Degrees): 0 Additional Comments: foot roll  Post Interventions Patient Tolerated: Well Instructions Provided: Care of device  [name] 07/16/2020, 10:53 AM

## 2020-07-16 NOTE — Interval H&P Note (Signed)
History and Physical Interval Note:  07/16/2020 7:35 AM  Elizabeth Bowen  has presented today for surgery, with the diagnosis of OA LEFT KNEE.  The various methods of treatment have been discussed with the patient and family. After consideration of risks, benefits and other options for treatment, the patient has consented to  Procedure(s): TOTAL KNEE ARTHROPLASTY (Left) as a surgical intervention.  The patient's history has been reviewed, patient examined, no change in status, stable for surgery.  I have reviewed the patient's chart and labs.  Questions were answered to the patient's satisfaction.     [name]

## 2020-07-16 NOTE — Progress Notes (Signed)
Orthopedic Tech Progress Note Patient Details:  Elizabeth Bowen 1950-12-20 [id number]  CPM Left Knee CPM Left Knee: Off Left Knee Flexion (Degrees): 90 Left Knee Extension (Degrees): 0 Additional Comments: Placed patient in zero knee  Post Interventions Patient Tolerated: Well Instructions Provided: Care of device  [name] 07/16/2020, 2:11 PM

## 2020-07-16 NOTE — Brief Op Note (Signed)
07/16/2020  10:03 AM  PATIENT:  Elizabeth Bowen  69 y.o. female  PRE-OPERATIVE DIAGNOSIS:  OA LEFT KNEE  POST-OPERATIVE DIAGNOSIS:  OA LEFT KNEE  PROCEDURE:  Procedure(s): TOTAL LEFT  KNEE ARTHROPLASTY (Left)  SURGEON:  Surgeon(s) and Role:    Earlie Server, MD - Primary  PHYSICIAN ASSISTANT: Chriss Czar, PA-C  ASSISTANTS: OR staff x1   ANESTHESIA:   local, regional, spinal, general and IV sedation  EBL:  50 mL   BLOOD ADMINISTERED:none  DRAINS: none   LOCAL MEDICATIONS USED:  MARCAINE     SPECIMEN:  No Specimen  DISPOSITION OF SPECIMEN:  N/A  COUNTS:  YES  TOURNIQUET:   Total Tourniquet Time Documented: Thigh (Left) - 67 minutes Total: Thigh (Left) - 67 minutes   DICTATION: .Other Dictation: Dictation Number unknown  PLAN OF CARE: Admit for overnight observation  PATIENT DISPOSITION:  PACU - hemodynamically stable.   Delay start of Pharmacological VTE agent (>24hrs) due to surgical blood loss or risk of bleeding: yes

## 2020-07-16 NOTE — Anesthesia Postprocedure Evaluation (Signed)
Anesthesia Post Note  Patient: Elizabeth Bowen  Procedure(s) Performed: TOTAL LEFT  KNEE ARTHROPLASTY (Left Knee)     Patient location during evaluation: PACU Anesthesia Type: General Level of consciousness: awake and alert Pain management: pain level controlled Vital Signs Assessment: post-procedure vital signs reviewed and stable Respiratory status: spontaneous breathing, nonlabored ventilation, respiratory function stable and patient connected to nasal cannula oxygen Cardiovascular status: blood pressure returned to baseline and stable Postop Assessment: no apparent nausea or vomiting Anesthetic complications: no   No complications documented.  Last Vitals:  Vitals:   07/16/20 1300 07/16/20 1348  BP: (!) 147/84 137/73  Pulse: 71 64  Resp: 14   Temp: 36.6 C 36.8 C  SpO2: 94% 98%    Last Pain:  Vitals:   07/16/20 1400  TempSrc:   PainSc: 8                  Kanasia Gayman COKER

## 2020-07-16 NOTE — Transfer of Care (Signed)
Immediate Anesthesia Transfer of Care Note  Patient: Elizabeth Bowen  Procedure(s) Performed: TOTAL LEFT  KNEE ARTHROPLASTY (Left Knee)  Patient Location: PACU  Anesthesia Type:GA combined with regional for post-op pain  Level of Consciousness: awake, alert , oriented and patient cooperative  Airway & Oxygen Therapy: Patient Spontanous Breathing and Patient connected to face mask  Post-op Assessment: Report given to RN and Post -op Vital signs reviewed and stable  Post vital signs: Reviewed and stable  Last Vitals:  Vitals Value Taken Time  BP    Temp    Pulse    Resp    SpO2      Last Pain:  Vitals:   07/16/20 0630  TempSrc:   PainSc: 0-No pain         Complications: No complications documented.

## 2020-07-16 NOTE — Progress Notes (Signed)
Anesthesiology note:  Elizabeth Bowen is a 69 year old female who underwent left total knee replacement today with Dr. French Ana.  She has a history of coronary artery disease with previous LAD stent placement in February 2019. No known history of A Fib.  She also has a history of hypertension and sleep apnea on CPAP and previous lap band gastric surgery in April 2015.  She has been asymptomatic from a cardiac standpoint and has been followed by Dr. Humphrey Rolls in [place].  During the surgery under general anesthesia she developed atrial fibrillation at a rate of 80-90 with stable blood pressure.  This lasted approximately 45 minutes and she subsequently converted to sinus rhythm.  She emerged from anesthesia without difficulty and has remained in sinus rhythm in the recovery room and denies palpitations shortness of breath chest pain or any other cardiac symptoms.  Vital signs: Temperature 36 5 blood pressure 146/80 heart rate 76 respiratory rate 12 oxygen saturation 100% on 2 L nasal cannula.  Heart: Regular rate and rhythm without murmurs or gallops Lungs: Clear with good air movement bilaterally  Twelve-lead ECG in recovery room sinus rhythm heart rate 74 left axis deviation and no ST or T wave changes from baseline tracing.  Impression: Transient atrial fibrillation intraoperatively in patient with no previous history of arrhythmia but previous coronary stent.  Patient now in sinus rhythm and appears stable.  Plan: Discussed with Trish from [facility].  Patient will be placed on telemetry, should she develop further episodes of atrial fibrillation, cardiology should be consulted.   Roberts Gaudy

## 2020-07-16 NOTE — Progress Notes (Signed)
Patient requested her friend Arrie Aran be added to her list.

## 2020-07-17 DIAGNOSIS — M1712 Unilateral primary osteoarthritis, left knee: Secondary | ICD-10-CM | POA: Diagnosis not present

## 2020-07-17 LAB — BASIC METABOLIC PANEL
Anion gap: 9 (ref 5–15)
BUN: 14 mg/dL (ref 8–23)
CO2: 23 mmol/L (ref 22–32)
Calcium: 8.4 mg/dL — ABNORMAL LOW (ref 8.9–10.3)
Chloride: 108 mmol/L (ref 98–111)
Creatinine, Ser: 0.7 mg/dL (ref 0.44–1.00)
GFR calc Af Amer: >60 mL/min (ref >60)
GFR calc non Af Amer: >60 mL/min (ref >60)
Glucose, Bld: 130 mg/dL — ABNORMAL HIGH (ref 70–99)
Potassium: 4.4 mmol/L (ref 3.5–5.1)
Sodium: 140 mmol/L (ref 135–145)

## 2020-07-17 LAB — CBC
HCT: 32.2 % — ABNORMAL LOW (ref 36.0–46.0)
Hemoglobin: 10.1 g/dL — ABNORMAL LOW (ref 12.0–15.0)
MCH: 29.8 pg (ref 26.0–34.0)
MCHC: 31.4 g/dL (ref 30.0–36.0)
MCV: 95 fL (ref 80.0–100.0)
Platelets: 211 10*3/uL (ref 150–400)
RBC: 3.39 MIL/uL — ABNORMAL LOW (ref 3.87–5.11)
RDW: 14 % (ref 11.5–15.5)
WBC: 10.7 10*3/uL — ABNORMAL HIGH (ref 4.0–10.5)
nRBC: 0 % (ref 0.0–0.2)

## 2020-07-17 NOTE — Progress Notes (Signed)
Physical Therapy Treatment Patient Details Name: Elizabeth Bowen MRN: [id number] DOB: [birth year] Today's Date: 07/17/2020    History of Present Illness Patient is 69 y.o. female s/p Lt TKA  on 07/16/20 with PMH significant for OSA, LVH, HTN, hypothyroidism, GERD, CAD, OA, depression, Rt TSA, bil CTR, and lubar surgery. Patient developed atrial fibrillation at a rate of 80-90 with stable blood pressure.  This lasted approximately 45 minutes and she subsequently converted to sinus rhythm.  She emerged from anesthesia without difficulty and has remained in sinus rhythm in the recovery room and denies palpitations shortness of breath chest pain or any other cardiac symptoms.    PT Comments    POD # 1 am session Assisted OOB.  General bed mobility comments: demonsrtated and instructed how to use a belt to self assit.  General transfer comment: 25% cues for technique with RW and safety with turns.  General Gait Details: 25% VC's on proper sequencing and safety with turns.  Then returned to room to perform some TE's following HEP handout.  Instructed on proper tech, freq as well as use of ICE.   Pt will need another PT session to complete HEP and ensure mobility safety before D/C to home later today.   Follow Up Recommendations  Follow surgeon's recommendation for DC plan and follow-up therapies;Home health PT     Equipment Recommendations  None recommended by PT    Recommendations for Other Services       Precautions / Restrictions Precautions Precautions: Fall Precaution Comments: instructed no pillow under knee Restrictions Weight Bearing Restrictions: No Other Position/Activity Restrictions: WBAT    Mobility  Bed Mobility Overal bed mobility: Needs Assistance Bed Mobility: Supine to Sit     Supine to sit: Supervision;Min guard     General bed mobility comments: demonsrtated and instructed how to use a belt to self asssit  Transfers Overall transfer level: Needs  assistance Equipment used: Rolling walker (2 wheeled) Transfers: Sit to/from Stand Sit to Stand: Supervision         General transfer comment: 25% cues for technique with RW and safety with turns  Ambulation/Gait Ambulation/Gait assistance: Min guard Gait Distance (Feet): 85 Feet Assistive device: Rolling walker (2 wheeled) Gait Pattern/deviations: Step-to pattern;Decreased stride length;Decreased weight shift to left Gait velocity: decr   General Gait Details: 25% VC's on proper sequencing and safety with turns   Marine scientist Rankin (Stroke Patients Only)       Balance                                            Cognition Arousal/Alertness: Awake/alert Behavior During Therapy: WFL for tasks assessed/performed Overall Cognitive Status: Within Functional Limits for tasks assessed                                 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      Exercises   Total Knee Replacement TE's following HEP handout 10 reps B LE ankle pumps 05 reps towel squeezes 05 reps knee presses 05 reps heel slides Educated on use of gait belt to assist with TE's Followed by ICE     General Comments  Pertinent Vitals/Pain Pain Assessment: 0-10 Pain Location: Lt knee Pain Descriptors / Indicators: Aching;Discomfort;Tightness Pain Intervention(s): Monitored during session;Premedicated before session;Repositioned;Ice applied    Home Living                      Prior Function            PT Goals (current goals can now be found in the care plan section) Progress towards PT goals: Progressing toward goals    Frequency    7X/week      PT Plan Current plan remains appropriate    Co-evaluation              AM-PAC PT "6 Clicks" Mobility   Outcome Measure  Help needed turning from your back to your side while in a flat bed without using  bedrails?: A Little Help needed moving from lying on your back to sitting on the side of a flat bed without using bedrails?: A Little Help needed moving to and from a bed to a chair (including a wheelchair)?: A Little Help needed standing up from a chair using your arms (e.g., wheelchair or bedside chair)?: A Little Help needed to walk in hospital room?: A Little Help needed climbing 3-5 steps with a railing? : A Little 6 Click Score: 18    End of Session Equipment Utilized During Treatment: Gait belt Activity Tolerance: Patient tolerated treatment well Patient left: in chair;with call bell/phone within reach;with chair alarm set;with family/visitor present Nurse Communication: Mobility status PT Visit Diagnosis: Muscle weakness (generalized) (M62.81);Difficulty in walking, not elsewhere classified (R26.2);Pain Pain - Right/Left: Left Pain - part of body: Knee     Time: 2355-7322 PT Time Calculation (min) (ACUTE ONLY): 25 min  Charges:  $Gait Training: 8-22 mins $Therapeutic Exercise: 8-22 mins                     Rica Koyanagi  PTA Acute  Rehabilitation Services Pager      [phone] Office      [phone]

## 2020-07-17 NOTE — Progress Notes (Signed)
   ORTHOPAEDIC PROGRESS NOTE  s/p Procedure(s): TOTAL LEFT  KNEE ARTHROPLASTY on 07/17/2020 with Dr. French Ana  SUBJECTIVE: Reports mild pain about operative site. Worked with therapy yesterday. No chest pain. No palpitations. No SOB. No nausea/vomiting. No other complaints.  OBJECTIVE: PE: General: sitting up in recliner. No acute distress. Pleasant Cardiac: normal rate Pulmonary: no increased work of breathing Left lower extremity: dressing CDI, intact EHL/TA/GSC, compartments soft and compressible, endorses distal sensation, warm well perfused foot   Vitals:   07/17/20 0135 07/17/20 0535  BP: 121/67 117/61  Pulse: 65 (!) 55  Resp: 18   Temp: 98.1 F (36.7 C) 97.7 F (36.5 C)  SpO2: 97% 96%    ASSESSMENT: Elizabeth Bowen is a 69 y.o. female POD#1. Kept for observation due to developing atrial fibrillation during surgery. Lasted approximately 45 minutes. Converted to sinus rhythm. She has remained in sinus rhythm since. No cardiac symptoms reported including palpitations, shortness of breath, chest pain.  PLAN: Weightbearing: WBAT LLE Insicional and dressing care: Reinforce dressings as needed Orthopedic device(s): None Showering: Post-op day #2 VTE prophylaxis: Aspirin 81 mg BID.  Pain control: PRN pain medications. Preferring oral medications Follow - up plan: 2 weeks in office with Dr. Toney Sang information: After hours and holidays please check [email or web address] for group call information for [facility]  Per Anesthesiologist Dr. Verneda Skill note: "Plan: Discussed with Trish from [place].  Patient will be placed on telemetry, should she develop further episodes of atrial fibrillation, cardiology should be consulted."  No further episodes of atrial fibrillation since surgery. Patient having no cardiac symptoms. We will plan for discharge home today. She was instructed to have close follow-up with her cardiologist as well to call orthopedic  emergency line for any concerns over the weekend.   Noemi Chapel, PA-C 07/17/2020

## 2020-07-17 NOTE — Op Note (Signed)
NAME: Clock, [place] [id number] ACCOUNT [id number] DATE OF BIRTH:October 29, 1951 FACILITY: [facility] LOCATION: [id number] PHYSICIAN:W. Thang Flett JR., MD  OPERATIVE REPORT  DATE OF PROCEDURE:  07/16/2020  PREOPERATIVE DIAGNOSIS:  Severe osteoarthritis with varus deformity and flexion contracture of left knee.  POSTOPERATIVE DIAGNOSIS:  Severe osteoarthritis with varus deformity and flexion contracture of left knee.     OPERATION:  Left total knee replacement (DePuy Attune size 6 femur, 83mm size 6 tibial bearing with size 5 tibia, 35 mm all poly patella.  SURGEON:  Vangie Bicker, MD  ASSISTANT:  Jennet Maduro, MD   ANESTHESIA:  Spinal block/general.  TOURNIQUET TIME:  66 minutes.    COMPLICATIONS:  No complications.  DESCRIPTION OF PROCEDURE:  The patient initially had a spinal with incomplete anesthesia leading to a general.  She was approached through a straight skin incision with medial parapatellar approach to the knee.  We did a 9 mm 5 degree valgus cut off the  distal femur.  We then cut provisionally about 3 mm below the most diseased medial compartment, followed by additional provisional cut of an additional 3-4 mm to obtain full extension.  Moderate flexion contracture was noted.  Stripping of the medial  side of the knee due to varus deformity as well.  Femur was sized to be a size 6.  Placement of the all-in-1 cutting block with appropriate anterior, posterior chamfer cuts.  Tibia was sized to be a size 5.  Prior to this, we removed a significant amount  of posterior loose bodies and osteophytes in the posterior aspect of the knee, released the PCL as well.  The patella was cut resecting somewhat thin patella of 7.5 mm, placement of 35 mm all-poly trial.  All trials were placed.  We eventually settled  on the 7 mm thickness bearing matching the femur.  Final components were inserted, tibia, followed by femur and patella.  Cement was allowed to harden with the  trial bearing in.  No excess cement was noted once the bearing was removed.  Tourniquet was  released.  No excessive bleeding was noted.  Small bleeders were coagulated.  We used TXA sponge as well as Marcaine with Exparel to minimize bleeding.  We trialed off the final component and again settled on the 7 mm thick bearing.  Closure was affected  with #1 Ethibond, 2-0 Vicryl, and Monocryl in the skin.  A lightly compressive sterile dressing and knee immobilizer applied.  Taken to recovery room in stable condition.  CN/NUANCE  D:07/16/2020 T:07/17/2020 JOB:[id number]

## 2020-07-17 NOTE — Progress Notes (Addendum)
Patient was seen by PT, patient walked, voided and was educated on safety with ambulation. Patient belongings were returned her. Patient will be leaving at 1pm today with her spouse.

## 2020-07-17 NOTE — Progress Notes (Signed)
Physical Therapy Treatment Patient Details Name: Elizabeth Bowen MRN: [id number] DOB: June 26, 1951 Today's Date: 07/17/2020    History of Present Illness Patient is 69 y.o. female s/p Lt TKA  on 07/16/20 with PMH significant for OSA, LVH, HTN, hypothyroidism, GERD, CAD, OA, depression, Rt TSA, bil CTR, and lubar surgery. Patient developed atrial fibrillation at a rate of 80-90 with stable blood pressure.  This lasted approximately 45 minutes and she subsequently converted to sinus rhythm.  She emerged from anesthesia without difficulty and has remained in sinus rhythm in the recovery room and denies palpitations shortness of breath chest pain or any other cardiac symptoms.    PT Comments    POD # 1 pm session Assisted with amb an increased distance Then returned to room to perform some TE's following HEP handout.  Instructed on proper tech, freq as well as use of ICE.  Also educated on ZERO foam.  Addressed all mobility questions, discussed appropriate activity, educated on use of ICE.  Pt ready for D/C to home.   Follow Up Recommendations  Follow surgeons recommendation for DC plan and follow-up therapies;Home health PT     Equipment Recommendations  None recommended by PT    Recommendations for Other Services       Precautions / Restrictions Precautions Precautions: Fall Precaution Comments: instructed no pillow under knee Restrictions Weight Bearing Restrictions: No Other Position/Activity Restrictions: WBAT    Mobility  Bed Mobility Overal bed mobility: Needs Assistance Bed Mobility: Supine to Sit     Supine to sit: Supervision;Min guard     General bed mobility comments: OOB in recliner  Transfers Overall transfer level: Needs assistance Equipment used: Rolling walker (2 wheeled) Transfers: Sit to/from Stand Sit to Stand: Supervision         General transfer comment: 25% cues for technique with RW and safety with turns  Ambulation/Gait Ambulation/Gait  assistance: Min guard Gait Distance (Feet): 115 Feet Assistive device: Bilateral platform walker Gait Pattern/deviations: Step-to pattern;Decreased stride length;Decreased weight shift to left Gait velocity: decr   General Gait Details: 25% VC's on proper sequencing and safety with turns   Stairs Stairs:  (has a RAMP)           Wheelchair Mobility    Modified Rankin (Stroke Patients Only)       Balance                                            Cognition Arousal/Alertness: Awake/alert Behavior During Therapy: WFL for tasks assessed/performed Overall Cognitive Status: Within Functional Limits for tasks assessed                                 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      Exercises   Total Knee Replacement TE's following HEP handout 10 reps B LE ankle pumps 05 reps towel squeezes 05 reps knee presses 05 reps heel slides  05 reps SAQ's 05 reps SLR's 05 reps ABD Educated on use of gait belt to assist with TE's Followed by ICE    General Comments        Pertinent Vitals/Pain Pain Assessment: 0-10 Pain Location: Lt knee Pain Descriptors / Indicators: Aching;Discomfort;Tightness Pain Intervention(s): Monitored during session;Premedicated before session;Repositioned;Ice applied    Home Living  Prior Function            PT Goals (current goals can now be found in the care plan section) Progress towards PT goals: Progressing toward goals    Frequency    7X/week      PT Plan Current plan remains appropriate    Co-evaluation              AM-PAC PT "6 Clicks" Mobility   Outcome Measure  Help needed turning from your back to your side while in a flat bed without using bedrails?: A Little Help needed moving from lying on your back to sitting on the side of a flat bed without using bedrails?: A Little Help needed moving to and from a bed to a chair  (including a wheelchair)?: A Little Help needed standing up from a chair using your arms (e.g., wheelchair or bedside chair)?: A Little Help needed to walk in hospital room?: A Little Help needed climbing 3-5 steps with a railing? : A Little 6 Click Score: 18    End of Session Equipment Utilized During Treatment: Gait belt Activity Tolerance: Patient tolerated treatment well Patient left: in chair;with call bell/phone within reach;with chair alarm set;with family/visitor present Nurse Communication: Mobility status PT Visit Diagnosis: Muscle weakness (generalized) (M62.81);Difficulty in walking, not elsewhere classified (R26.2);Pain Pain - Right/Left: Left Pain - part of body: Knee     Time: 10:47 - 11:12 PT Time Calculation (min) (ACUTE ONLY): 26 min  Charges:  $Gait Training: 8-22 mins $Therapeutic Exercise: 8-22 mins                     Rica Koyanagi  PTA Acute  Rehabilitation Services Pager      [phone] Office      [phone]

## 2020-07-19 ENCOUNTER — Encounter (HOSPITAL_COMMUNITY): Payer: Self-pay | Admitting: Orthopedic Surgery

## 2020-07-27 NOTE — Addendum Note (Signed)
Addendum  created 07/27/20 1807 by Roberts Gaudy, MD   Clinical Note Signed, Intraprocedure Blocks edited

## 2020-07-30 NOTE — Discharge Summary (Signed)
PATIENT ID: Elizabeth Bowen        MRN:  [id number]          DOB/AGE: 1951/11/24 / 69 y.o.    DISCHARGE SUMMARY  ADMISSION DATE:    07/16/2020 DISCHARGE DATE:  07/17/2020  ADMISSION DIAGNOSIS: Degenerative joint disease of left knee [M17.12] S/P total knee arthroplasty, left [Z96.652]    DISCHARGE DIAGNOSIS:  OA LEFT KNEE    ADDITIONAL DIAGNOSIS: Active Problems:   Degenerative joint disease of left knee   S/P total knee arthroplasty, left  Past Medical History:  Diagnosis Date  . Arthritis   . Bilateral carpal tunnel syndrome   . Cancer ([place])    skin  . Coronary artery disease   . Depression   . GERD (gastroesophageal reflux disease)   . History of anemia   . History of gallstones   . History of gastric ulcer   . History of hiatal hernia   . Hypertension   . Hypothyroidism   . 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 LVH (left ventricular hypertrophy) 05/21/2018   noted on EKG  . Morbid obesity ([place])   . OSA on CPAP     PROCEDURE: Procedure(s): TOTAL LEFT  KNEE ARTHROPLASTY Left on 07/16/2020  CONSULTS: cardiology phone call, no official consult    HISTORY:  See H&P in chart  HOSPITAL COURSE:  Elizabeth Bowen is a 69 y.o. admitted on 07/16/2020 and found to have a diagnosis of OA LEFT KNEE.  After appropriate laboratory studies were obtained  they were taken to the operating room on 07/16/2020 and underwent  Procedure(s): TOTAL LEFT  KNEE ARTHROPLASTY  Left.   They were given perioperative antibiotics:  Anti-infectives (From admission, onward)   Start     Dose/Rate Route Frequency Ordered Stop   07/16/20 1400  ceFAZolin (ANCEF) IVPB 2g/100 mL premix        2 g 200 mL/hr over 30 Minutes Intravenous Every 6 hours 07/16/20 1103 07/16/20 2225   07/16/20 0630  ceFAZolin (ANCEF) IVPB 2g/100 mL premix        2 g 200 mL/hr over 30 Minutes Intravenous On call to O.R. 07/16/20 2035 07/16/20 0751    .  Tolerated the procedure well.  Placed  with a foley intraoperatively.   Irregular hear rhythm intraop recommended overnight stay on tele for observation.   POD #1, allowed out of bed to a chair.  PT for ambulation and exercise program.  Foley D/C'd in morning.  IV saline locked.  O2 discontionued. No further episodes of afib overnight.   The remainder of the hospital course was dedicated to ambulation and strengthening.   The patient was discharged on 07/17/2020 in  Stable condition.  Blood products given:none  DIAGNOSTIC STUDIES: Recent vital signs: No data found.     Recent laboratory studies: No results for input(s): WBC, HGB, HCT, PLT in the last 168 hours. No results for input(s): NA, K, CL, CO2, BUN, CREATININE, GLUCOSE, CALCIUM in the last 168 hours. Lab Results  Component Value Date   INR 1.0 07/06/2020     Recent Radiographic Studies :  No results found.  DISCHARGE INSTRUCTIONS:   DISCHARGE MEDICATIONS:   Allergies as of 07/17/2020      Reactions   Statins Other (See Comments)   Leg pain    Sulfa Antibiotics Rash   Vioxx [rofecoxib] Rash      Medication List    STOP taking these medications  diclofenac Sodium 1 % Gel Commonly known as: VOLTAREN     TAKE these medications   acetaminophen 500 MG tablet Commonly known as: TYLENOL Take 1,000 mg by mouth every 6 (six) hours as needed (for pain.).   amLODipine 10 MG tablet Commonly known as: NORVASC Take 10 mg by mouth daily.   amLODipine 5 MG tablet Commonly known as: NORVASC Take 5 mg by mouth daily.   aspirin EC 81 MG tablet Take 1 tablet (81 mg total) by mouth 2 (two) times daily. Swallow whole. What changed: when to take this   clopidogrel 75 MG tablet Commonly known as: PLAVIX Take 75 mg by mouth daily.   FLUoxetine 20 MG capsule Commonly known as: PROZAC Take 40 mg by mouth daily.   levothyroxine 125 MCG tablet Commonly known as: SYNTHROID Take 125 mcg by mouth daily before breakfast.   lisinopril 5 MG tablet Commonly known  as: ZESTRIL Take 5 mg by mouth daily.   metoprolol succinate 25 MG 24 hr tablet Commonly known as: TOPROL-XL Take 12.5 mg by mouth daily.   oxyCODONE 5 MG immediate release tablet Commonly known as: Oxy IR/ROXICODONE Take one tab po q4-6hrs prn pain, may need 1-2 first couple weeks   pantoprazole 40 MG tablet Commonly known as: PROTONIX Take 40 mg by mouth daily before breakfast.   Repatha SureClick 982 MG/ML Soaj Generic drug: Evolocumab Inject 140 mg into the skin every 14 (fourteen) days.       FOLLOW UP VISIT:    Follow-up Information    Earlie Server, MD. Schedule an appointment as soon as possible for a visit in 2 weeks.   Specialty: Orthopedic Surgery Contact information: [address] [place] [address] [phone]        [facility].   Why: Will begin services with you on Monday Contact information: [address], [place] [address]  Phone: [phone]              DISPOSITION:   Home  CONDITION:  Stable  Chriss Czar, PA-C  07/30/2020 8:44 AM

## 2021-03-15 ENCOUNTER — Other Ambulatory Visit: Payer: Self-pay

## 2021-03-15 ENCOUNTER — Ambulatory Visit (HOSPITAL_COMMUNITY)
Admission: RE | Admit: 2021-03-15 | Discharge: 2021-03-15 | Disposition: A | Discharge status: Home / Self Care | Payer: Medicare Other | Source: Ambulatory Visit | Attending: Cardiology | Admitting: Cardiology

## 2021-03-15 ENCOUNTER — Other Ambulatory Visit (HOSPITAL_COMMUNITY): Payer: Self-pay | Admitting: Orthopedic Surgery

## 2021-03-15 DIAGNOSIS — M79662 Pain in left lower leg: Secondary | ICD-10-CM

## 2021-03-15 DIAGNOSIS — M7989 Other specified soft tissue disorders: Secondary | ICD-10-CM

## 2021-03-15 DIAGNOSIS — M79661 Pain in right lower leg: Secondary | ICD-10-CM | POA: Diagnosis not present

## 2022-04-03 ENCOUNTER — Encounter: Payer: Self-pay | Admitting: Gastroenterology

## 2022-04-27 ENCOUNTER — Ambulatory Visit (INDEPENDENT_AMBULATORY_CARE_PROVIDER_SITE_OTHER): Payer: Medicare Other | Admitting: Gastroenterology

## 2022-04-27 ENCOUNTER — Encounter: Payer: Self-pay | Admitting: Gastroenterology

## 2022-04-27 VITALS — BP 132/72 | HR 60 | Ht 64.0 in | Wt 220.0 lb

## 2022-04-27 DIAGNOSIS — R0789 Other chest pain: Secondary | ICD-10-CM

## 2022-04-27 DIAGNOSIS — Z1212 Encounter for screening for malignant neoplasm of rectum: Secondary | ICD-10-CM

## 2022-04-27 DIAGNOSIS — Z7902 Long term (current) use of antithrombotics/antiplatelets: Secondary | ICD-10-CM

## 2022-04-27 DIAGNOSIS — Z1211 Encounter for screening for malignant neoplasm of colon: Secondary | ICD-10-CM

## 2022-04-27 MED ORDER — NA SULFATE-K SULFATE-MG SULF 17.5-3.13-1.6 GM/177ML PO SOLN: 1.0000 | Freq: Once | ORAL | 0 refills | Status: AC

## 2022-04-27 NOTE — Patient Instructions (Addendum)
If you are age 69 or older, your body mass index should be between 23-30. Your Body mass index is 37.76 kg/m. If this is out of the aforementioned range listed, please consider follow up with your Primary Care Provider.  If you are age 70 or younger, your body mass index should be between 19-25. Your Body mass index is 37.76 kg/m. If this is out of the aformentioned range listed, please consider follow up with your Primary Care Provider.  You have been scheduled for a colonoscopy. Please follow written instructions given to you at your visit today.  Please pick up your prep supplies at the pharmacy within the next 1-3 days. If you use inhalers (even only as needed), please bring them with you on the day of your procedure.  The [facility] providers would like to encourage you to use [facility] to communicate with providers for non-urgent requests or questions.  Due to long hold times on the telephone, sending your provider a message by [facility] may be a faster and more efficient way to get a response.  Please allow 48 business hours for a response.  Please remember that this is for non-urgent requests.   It was a pleasure to see you today!  Thank you for trusting me with your gastrointestinal care!    Scott E.Candis Schatz, MD

## 2022-04-27 NOTE — Progress Notes (Signed)
HPI : Elizabeth Bowen is a very pleasant 71 year old female with a history of CAD s/p stent 2019 on DAPT who is referred to [place] by Noralee Chars, [facility] for colon cancer screening.  The patient states that her last colon cancer screening was a Cologuard about 5 years ago.  She had a colonoscopy about 10 years prior to that in [place], [place] which was normal, per patient.  She denies any chronic lower GI symptoms such as abdominal pain, diarrhea or blood in the stool.  Sometimes she has issues with constipation.  For the past couple of weeks, she has noted a decrease in the size of her stools, sometimes with straining.  Her weight is stable.  She has no family history of colon cancer. She has a history of heartburn which is well controlled with once daily Protonix.   She went to the ED in April with chest pain and diaphoresis.  She says this pain was not at all similar to her GERD symptoms.  The pain was similar to the pain she was having when she was found to have obstructive CAD and underwent stent placement.  Her EKG was reassuring and her troponins were normal and she was discharged from the ED with close cardiology follow up.  She subsequently underwent a chemical stress test which was normal and is now undergoing a cardiac monitor.  She has follow up with her cardiologist in 2 weeks.  She denies recurrence of the chest pain.  She does report some shortness of breath with activity, getting winded after walking a city block.  She is active around the house with chores/cleaning, but otherwise does not exercise. She has a gastric lap band, and it was thought that this may have been the source of her pain.  She followed up with bariatric surgery and some fluid removed from the port.  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   Noted on EKG, [facility]   LVH (left ventricular hypertrophy) 05/21/2018   noted on EKG   Morbid obesity (HCC)    OSA on CPAP      Past Surgical History:  Procedure Laterality Date   ABDOMINAL HYSTERECTOMY     BACK SURGERY     Lumbar disc   CARPAL TUNNEL RELEASE Bilateral    CERVICAL FUSION     CHOLECYSTECTOMY     COLONOSCOPY     CORONARY ANGIOPLASTY  2019   Stent placement   KNEE CARTILAGE SURGERY Bilateral    bilateral   LAPAROSCOPIC GASTRIC BANDING  02/2014   TOTAL KNEE ARTHROPLASTY Left 07/16/2020   Procedure: TOTAL LEFT  KNEE ARTHROPLASTY;  Surgeon: Earlie Server, MD;  Location: [facility];  Service: Orthopedics;  Laterality: Left;   TOTAL SHOULDER ARTHROPLASTY Right 02/12/2019   Procedure: TOTAL SHOULDER ARTHROPLASTY;  Surgeon: Hiram Gash, MD;  Location: [facility];  Service: Orthopedics;  Laterality: Right;   UPPER GI ENDOSCOPY     WISDOM TOOTH EXTRACTION     Family History  Problem Relation Age of Onset   Lung  cancer Mother    Sick sinus syndrome Mother    Heart disease Mother    Lung cancer Father    Lung cancer Brother    Colon cancer Neg Hx    Rectal cancer Neg Hx    Stomach cancer Neg Hx    Esophageal cancer Neg Hx    Pancreatic cancer Neg Hx    Liver cancer Neg Hx    Social History   Tobacco Use   Smoking status: Former    Packs/day: 2.00    Types: Cigarettes   Smokeless tobacco: Never   Tobacco comments:    quit 20 years ago, smoked off and on 15 years  Vaping Use   Vaping Use: Former  Substance Use Topics   Alcohol use: Not Currently   Drug use: No   Current Outpatient Medications  Medication Sig Dispense Refill   acetaminophen (TYLENOL) 500 MG tablet Take 1,000 mg by mouth every 6 (six) hours as needed (for pain.).     amLODipine (NORVASC) 10 MG tablet Take 10 mg by mouth daily.      aspirin EC 81 MG tablet Take 81 mg by mouth daily. Swallow whole.     clopidogrel (PLAVIX) 75 MG tablet Take 75 mg by mouth daily.     FLUoxetine (PROZAC) 20 MG capsule Take 40 mg by mouth daily.      levothyroxine (SYNTHROID, LEVOTHROID) 125 MCG tablet Take 125 mcg by mouth daily before breakfast.      lisinopril (ZESTRIL) 5 MG tablet Take 5 mg by mouth daily.     pantoprazole (PROTONIX) 40 MG tablet Take 40 mg by mouth daily before breakfast.     REPATHA SURECLICK 119 MG/ML SOAJ Inject 140 mg into the skin every 14 (fourteen) days.     metoprolol succinate (TOPROL-XL) 25 MG 24 hr tablet Take 12.5 mg by mouth daily. (Patient not taking: Reported on 04/27/2022)     No current facility-administered medications for this visit.   Allergies  Allergen Reactions   Statins Other (See Comments)    Leg pain    Sulfa Antibiotics Rash   Vioxx [Rofecoxib] Rash     Review of Systems: All systems reviewed and negative except where noted in HPI.    No results found.  Physical Exam: BP 132/72   Pulse 60   Ht '5\' 4"'$  (1.626 m)   Wt 220 lb (99.8 kg)   SpO2 97%   BMI 37.76 kg/m  Constitutional: Pleasant,well-developed, Caucasian female in no acute distress. HEENT: Normocephalic and atraumatic. Conjunctivae are normal. No scleral icterus. Neck supple.  Cardiovascular: Normal rate, regular rhythm.  Pulmonary/chest: Effort normal and breath sounds normal. No wheezing, rales or rhonchi. Abdominal: Soft, nondistended, nontender. Bowel sounds active throughout. There are no masses palpable. No hepatomegaly. Extremities: no edema Neurological: Alert and oriented to person place and time. Skin: Skin is warm and dry. No rashes noted. Psychiatric: Normal mood and affect. Behavior is normal.  CBC    Component Value Date/Time   WBC 10.7 (H) 07/17/2020 0349   RBC 3.39 (L) 07/17/2020 0349   HGB 10.1 (L) 07/17/2020 0349   HCT 32.2 (L) 07/17/2020 0349   PLT 211 07/17/2020 0349   MCV 95.0 07/17/2020  0349   MCH 29.8 07/17/2020 0349   MCHC 31.4 07/17/2020 0349   RDW 14.0 07/17/2020 0349   LYMPHSABS 1.7 07/06/2020 1209   MONOABS 0.6 07/06/2020 1209   EOSABS 0.3 07/06/2020 1209   BASOSABS 0.0 07/06/2020 1209    CMP  Component Value Date/Time   NA 140 07/17/2020 0349   K 4.4 07/17/2020 0349   CL 108 07/17/2020 0349   CO2 23 07/17/2020 0349   GLUCOSE 130 (H) 07/17/2020 0349   BUN 14 07/17/2020 0349   CREATININE 0.70 07/17/2020 0349   CALCIUM 8.4 (L) 07/17/2020 0349   PROT 7.4 07/06/2020 1209   ALBUMIN 4.2 07/06/2020 1209   AST 16 07/06/2020 1209   ALT 18 07/06/2020 1209   ALKPHOS 61 07/06/2020 1209   BILITOT 0.5 07/06/2020 1209   GFRNONAA >60 07/17/2020 0349   GFRAA >60 07/17/2020 0349     ASSESSMENT AND PLAN: 71 year old female with CAD s/p stent on DAPT due for colon cancer screening.  She has some occasional constipation and recent change in stool volume for the past 2 weeks, but otherwise denies any lower GI symptoms.  She has typical GERD symptoms, and recently was seen in the ED for chest pain which the patient feels was very similar to her index angina that led her stent placement.  Recent stress test was reassuring and she is undergoing cardiac monitoring with close cardiology follow up.  She also has gastric lap band and had fluid removed in the event this was the source of her pain.  Her pain seemed unlikely to be GERD in origin.  We discussed colon cancer screening (repeat Cologuard vs colonoscopy) and the patient would like to proceed with a colonoscopy.  Given the ongoing cardiology evaluation, I would want her cardiologist to be confident that there is no concern for underlying cardiac pathology before proceeding with an elective sedated procedure.  Will book a colonoscopy for late July in the hopes that her cardiology will be complete.  If her cardiology evaluation ongoing by then, we will need to push back her colonoscopy.  Her Plavix will need to held 5 days  prior to her colonoscopy.  CRC screening - Colonoscopy, book in late July to ensure cardiology evaluation complete - Hold Plavix 5 days prior to colonoscopy - If cardiology evaluation still ongoing by time of colonoscopy, will need to reschedule colonoscopy  Chest pain, isolated episode - Low suspicion for GERD - Follow up cardiology  The details, risks (including bleeding, perforation, infection, missed lesions, medication reactions and possible hospitalization or surgery if complications occur), benefits, and alternatives to colonoscopy with possible biopsy and possible polypectomy were discussed with the patient and she consents to proceed.   Kelli Egolf E. Candis Schatz, MD [facility]  CC:  McAlexander, Magdalene [place],*

## 2022-05-01 ENCOUNTER — Encounter: Payer: Self-pay | Admitting: Gastroenterology

## 2022-05-31 ENCOUNTER — Telehealth: Payer: Self-pay | Admitting: Gastroenterology

## 2022-05-31 NOTE — Telephone Encounter (Signed)
Patient called has an up coming procedure schedule 06/06/22 called to follow up on clearance from her cardiologist also provided a fax number for them [phone].

## 2022-05-31 NOTE — Telephone Encounter (Signed)
Called Dr.Khan's office and spoke with his nurse she gave me the correct Fax number and stated that she would fax clearance letter back to.

## 2022-06-01 NOTE — Telephone Encounter (Signed)
Called patient and informed her to hold Plavix starting today. Patient stated understanding and had no questions at the end of call.

## 2022-06-01 NOTE — Telephone Encounter (Signed)
Received fax from [place] office stating that patient may have Plavix 5 days prior to her procedure and should be restarted as soon as possible postprocedure.

## 2022-06-06 ENCOUNTER — Ambulatory Visit (AMBULATORY_SURGERY_CENTER): Payer: Medicare Other | Admitting: Gastroenterology

## 2022-06-06 ENCOUNTER — Encounter: Payer: Self-pay | Admitting: Gastroenterology

## 2022-06-06 VITALS — BP 130/87 | HR 58 | Temp 96.4°F | Resp 12 | Ht 64.0 in | Wt 220.0 lb

## 2022-06-06 DIAGNOSIS — K5140 Inflammatory polyps of colon without complications: Secondary | ICD-10-CM | POA: Diagnosis not present

## 2022-06-06 DIAGNOSIS — Z1211 Encounter for screening for malignant neoplasm of colon: Secondary | ICD-10-CM

## 2022-06-06 DIAGNOSIS — Z1212 Encounter for screening for malignant neoplasm of rectum: Secondary | ICD-10-CM

## 2022-06-06 DIAGNOSIS — D120 Benign neoplasm of cecum: Secondary | ICD-10-CM

## 2022-06-06 DIAGNOSIS — K388 Other specified diseases of appendix: Secondary | ICD-10-CM | POA: Diagnosis not present

## 2022-06-06 MED ORDER — SODIUM CHLORIDE 0.9 % IV SOLN: 500.0000 mL | Freq: Once | INTRAVENOUS | Status: DC

## 2022-06-06 NOTE — Op Note (Signed)
[facility] Patient Name: Elizabeth Bowen Procedure Date: 06/06/2022 1:29 PM MRN: [id number] Endoscopist: Nicki Reaper E. Candis Schatz , MD Age: 71 Referring MD:  Date of Birth: Mar 02, 1951 Gender: Female Account #: [id number] Procedure:                Colonoscopy Indications:              Screening for colorectal malignant neoplasm (last                            colonoscopy was more than 10 years ago) Medicines:                Monitored Anesthesia Care Procedure:                Pre-Anesthesia Assessment:                           - Prior to the procedure, a History and Physical                            was performed, and patient medications and                            allergies were reviewed. The patient's tolerance of                            previous anesthesia was also reviewed. The risks                            and benefits of the procedure and the sedation                            options and risks were discussed with the patient.                            All questions were answered, and informed consent                            was obtained. Prior Anticoagulants: The patient has                            taken Plavix (clopidogrel), last dose was 6 days                            prior to procedure. ASA Grade Assessment: III - A                            patient with severe systemic disease. After                            reviewing the risks and benefits, the patient was                            deemed in satisfactory condition to undergo the  procedure.                           After obtaining informed consent, the colonoscope                            was passed under direct vision. Throughout the                            procedure, the patient's blood pressure, pulse, and                            oxygen saturations were monitored continuously. The                            Olympus CF-HQ190L [id number]) Colonoscope was                             introduced through the anus and advanced to the the                            cecum, identified by appendiceal orifice and                            ileocecal valve. The colonoscopy was somewhat                            difficult due to a tortuous colon. The patient                            tolerated the procedure well. The quality of the                            bowel preparation was adequate. The ileocecal                            valve, appendiceal orifice, and rectum were                            photographed. The bowel preparation used was SUPREP                            via split dose instruction. Scope In: 2:39:50 PM Scope Out: 3:04:14 PM Scope Withdrawal Time: 0 hours 16 minutes 49 seconds  Total Procedure Duration: 0 hours 24 minutes 24 seconds  Findings:                 The perianal and digital rectal examinations were                            normal. Pertinent negatives include normal                            sphincter tone and no palpable rectal lesions.  A 3 mm polyp was found in the appendiceal orifice.                            The polyp was sessile. The polyp was removed with a                            cold snare. Resection and retrieval were complete.                            Estimated blood loss was minimal.                           The exam was otherwise normal throughout the                            examined colon.                           The retroflexed view of the distal rectum and anal                            verge was normal and showed no anal or rectal                            abnormalities.                           A few small-mouthed diverticula were found in the                            ascending colon. Complications:            No immediate complications. Estimated Blood Loss:     Estimated blood loss was minimal. Impression:               - One 3 mm polyp at the appendiceal  orifice,                            removed with a cold snare. Resected and retrieved.                           - The distal rectum and anal verge are normal on                            retroflexion view.                           - Diverticulosis in the ascending colon. Recommendation:           - Patient has a contact number available for                            emergencies. The signs and symptoms of potential  delayed complications were discussed with the                            patient. Return to normal activities tomorrow.                            Written discharge instructions were provided to the                            patient.                           - Resume previous diet.                           - Continue present medications.                           - Await pathology results.                           - Repeat colonoscopy (date not yet determined) for                            surveillance based on pathology results.                           - Resume Plavix (clopidogrel) at prior dose                            tomorrow. Dreya Buhrman E. Candis Schatz, MD 06/06/2022 3:11:03 PM This report has been signed electronically.

## 2022-06-06 NOTE — Progress Notes (Signed)
[facility] History and Physical   Primary Care Physician:  Joseph Art, MD   Reason for Procedure:   Colon cancer screening  Plan:    Screening colonoscopy     HPI: Elizabeth Bowen is a 71 y.o. female undergoing initial average risk screening colonoscopy.  She has no family history of colon cancer and no chronic GI symptoms. She had a negative colonoscopy over 10 years ago and a negative Cologuard 5 years ago. She had an episode of chest pain in April and went to the ED.  A subsequent stress test and cardiology evaluation was negative for a cardiac cause.  She takes Plavix for a history of CAD/stent, last dose 5 July 5th.   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   Noted on EKG, [facility]   LVH (left ventricular hypertrophy) 05/21/2018   noted on EKG   Morbid obesity (HCC)    OSA on CPAP     Past Surgical History:  Procedure Laterality Date   ABDOMINAL HYSTERECTOMY     BACK SURGERY     Lumbar disc   CARPAL TUNNEL RELEASE Bilateral    CERVICAL FUSION     CHOLECYSTECTOMY     COLONOSCOPY     CORONARY ANGIOPLASTY  2019   Stent placement   KNEE CARTILAGE SURGERY Bilateral    bilateral   LAPAROSCOPIC GASTRIC BANDING  02/2014   TOTAL KNEE ARTHROPLASTY Left 07/16/2020   Procedure: TOTAL LEFT  KNEE ARTHROPLASTY;  Surgeon: Earlie Server, MD;  Location: [facility];  Service: Orthopedics;  Laterality: Left;   TOTAL SHOULDER ARTHROPLASTY Right 02/12/2019   Procedure: TOTAL SHOULDER ARTHROPLASTY;  Surgeon: Hiram Gash, MD;  Location: [facility];  Service: Orthopedics;  Laterality: Right;   UPPER GI ENDOSCOPY     WISDOM TOOTH  EXTRACTION      Prior to Admission medications   Medication Sig Start Date End Date Taking? Authorizing Provider  amLODipine (NORVASC) 10 MG tablet Take 10 mg by mouth daily.   Yes [provider]  aspirin EC 81 MG tablet Take 81 mg by mouth daily. Swallow whole.   Yes [provider]  FLUoxetine (PROZAC) 20 MG capsule Take 40 mg by mouth daily.    Yes [provider]  levothyroxine (SYNTHROID, LEVOTHROID) 125 MCG tablet Take 125 mcg by mouth daily before breakfast.    Yes [provider]  lisinopril (ZESTRIL) 5 MG tablet Take 5 mg by mouth daily.   Yes [provider]  pantoprazole (PROTONIX) 40 MG tablet Take 40 mg by mouth daily before breakfast. 05/26/20  Yes [provider]  acetaminophen (TYLENOL) 500 MG tablet Take 1,000 mg by mouth every 6 (six) hours as needed (for pain.).    [provider]  clopidogrel (PLAVIX) 75 MG tablet Take 75 mg by mouth daily.    [provider]  REPATHA SURECLICK 170 MG/ML SOAJ Inject 140 mg into the skin every 14 (fourteen) days. 06/08/20   [provider]    Current Outpatient Medications  Medication Sig Dispense Refill  amLODipine (NORVASC) 10 MG tablet Take 10 mg by mouth daily.     aspirin EC 81 MG tablet Take 81 mg by mouth daily. Swallow whole.     FLUoxetine (PROZAC) 20 MG capsule Take 40 mg by mouth daily.      levothyroxine (SYNTHROID, LEVOTHROID) 125 MCG tablet Take 125 mcg by mouth daily before breakfast.      lisinopril (ZESTRIL) 5 MG tablet Take 5 mg by mouth daily.     pantoprazole (PROTONIX) 40 MG tablet Take 40 mg by mouth daily before breakfast.     acetaminophen (TYLENOL) 500 MG tablet Take 1,000 mg by mouth every 6 (six) hours as needed (for pain.).     clopidogrel (PLAVIX) 75 MG tablet Take 75 mg by mouth daily.     REPATHA SURECLICK 732 MG/ML SOAJ Inject 140 mg into the skin every 14 (fourteen) days.     Current Facility-Administered Medications   Medication Dose Route Frequency Provider Last Rate Last Admin   0.9 %  sodium chloride infusion  500 mL Intravenous Once Daryel November, MD        Allergies as of 06/06/2022 - Review Complete 06/06/2022  Allergen Reaction Noted   Statins Other (See Comments) 01/30/2019   Sulfa antibiotics Rash 08/16/2011   Vioxx [rofecoxib] Rash 08/18/2011    Family History  Problem Relation Age of Onset   Lung cancer Mother    Sick sinus syndrome Mother    Heart disease Mother    Lung cancer Father    Lung cancer Brother    Colon cancer Neg Hx    Rectal cancer Neg Hx    Stomach cancer Neg Hx    Esophageal cancer Neg Hx    Pancreatic cancer Neg Hx    Liver cancer Neg Hx     Social History   Socioeconomic History   Marital status: Married    Spouse name: Not on file   Number of children: Not on file   Years of education: Not on file   Highest education level: Not on file  Occupational History   Not on file  Tobacco Use   Smoking status: Former    Packs/day: 2.00    Types: Cigarettes   Smokeless tobacco: Never   Tobacco comments:    quit 20 years ago, smoked off and on 15 years  Vaping Use   Vaping Use: Former  Substance and Sexual Activity   Alcohol use: Not Currently   Drug use: No   Sexual activity: Not on file  Other Topics Concern   Not on file  Social History Narrative   Not on file   Social Determinants of Health   Financial Resource Strain: Not on file  Food Insecurity: Not on file  Transportation Needs: Not on file  Physical Activity: Not on file  Stress: Not on file  Social Connections: Not on file  Intimate Partner Violence: Not on file    Review of Systems:  All other review of systems negative except as mentioned in the HPI.  Physical Exam: Vital signs BP (!) 142/74   Pulse (!) 58   Temp (!) 96.4 F (35.8 C)   Ht '5\' 4"'$  (1.626 m)   Wt 220 lb (99.8 kg)   SpO2 98%   BMI 37.76 kg/m   General:   Alert,  Well-developed, well-nourished,  pleasant and cooperative in NAD Airway:  Mallampati 2 Lungs:  Clear throughout to auscultation.   Heart:  Regular rate and rhythm; no murmurs, clicks,  rubs,  or gallops. Abdomen:  Soft, nontender and nondistended. Normal bowel sounds.   Neuro/Psych:  Normal mood and affect. A and O x 3   Paeton Latouche E. Candis Schatz, MD [facility]

## 2022-06-06 NOTE — Patient Instructions (Addendum)
Handout on polyps provided   Await pathology results.   Continue current medications.   Restart Plavix (clopidogrel) at prior dose tomorrow 06/07/22  YOU HAD AN ENDOSCOPIC PROCEDURE TODAY AT [facility]:   Refer to the procedure report that was given to you for any specific questions about what was found during the examination.  If the procedure report does not answer your questions, please call your gastroenterologist to clarify.  If you requested that your care partner not be given the details of your procedure findings, then the procedure report has been included in a sealed envelope for you to review at your convenience later.  YOU SHOULD EXPECT: Some feelings of bloating in the abdomen. Passage of more gas than usual.  Walking can help get rid of the air that was put into your GI tract during the procedure and reduce the bloating. If you had a lower endoscopy (such as a colonoscopy or flexible sigmoidoscopy) you may notice spotting of blood in your stool or on the toilet paper. If you underwent a bowel prep for your procedure, you may not have a normal bowel movement for a few days.  Please Note:  You might notice some irritation and congestion in your nose or some drainage.  This is from the oxygen used during your procedure.  There is no need for concern and it should clear up in a day or so.  SYMPTOMS TO REPORT IMMEDIATELY:  Following lower endoscopy (colonoscopy or flexible sigmoidoscopy):  Excessive amounts of blood in the stool  Significant tenderness or worsening of abdominal pains  Swelling of the abdomen that is new, acute  Fever of 100F or higher  For urgent or emergent issues, a gastroenterologist can be reached at any hour by calling [phone]. Do not use MyChart messaging for urgent concerns.    DIET:  We do recommend a small meal at first, but then you may proceed to your regular diet.  Drink plenty of fluids but you should avoid alcoholic beverages  for 24 hours.  ACTIVITY:  You should plan to take it easy for the rest of today and you should NOT DRIVE or use heavy machinery until tomorrow (because of the sedation medicines used during the test).    FOLLOW UP: Our staff will call the number listed on your records the next business day following your procedure.  We will call around 7:15- 8:00 am to check on you and address any questions or concerns that you may have regarding the information given to you following your procedure. If we do not reach you, we will leave a message.  If you develop any symptoms (ie: fever, flu-like symptoms, shortness of breath, cough etc.) before then, please call [phone].  If you test positive for Covid 19 in the 2 weeks post procedure, please call and report this information to [place].    If any biopsies were taken you will be contacted by phone or by letter within the next 1-3 weeks.  Please call us at [phone] if you have not heard about the biopsies in 3 weeks.    SIGNATURES/CONFIDENTIALITY: You and/or your care partner have signed paperwork which will be entered into your electronic medical record.  These signatures attest to the fact that that the information above on your After Visit Summary has been reviewed and is understood.  Full responsibility of the confidentiality of this discharge information lies with you and/or your care-partner.

## 2022-06-06 NOTE — Progress Notes (Signed)
Called to room to assist during endoscopic procedure.  Patient ID and intended procedure confirmed with present staff. Received instructions for my participation in the procedure from the performing physician.  

## 2022-06-06 NOTE — Progress Notes (Signed)
Sedate, gd SR, tolerated procedure well, VSS, report to RN 

## 2022-06-09 NOTE — Progress Notes (Signed)
Elizabeth Bowen,  Good news: the polyp that I removed during your recent examination was NOT precancerous.  You should continue to follow current colorectal cancer screening guidelines with a repeat colonoscopy in 10 years.  Given that you will be 80 at that time and we generally stop screening colonoscopies at age 69, I would recommend you have a discussion with your primary care doctor or myself at that time, to determine if further colon cancer screening is appropriate.  If you develop any new rectal bleeding, abdominal pain or significant bowel habit changes, please contact me before then.

## 2024-07-23 ENCOUNTER — Other Ambulatory Visit: Payer: Self-pay | Admitting: General Surgery

## 2024-07-23 DIAGNOSIS — Z9884 Bariatric surgery status: Secondary | ICD-10-CM

## 2024-08-28 ENCOUNTER — Ambulatory Visit
Admission: RE | Admit: 2024-08-28 | Discharge: 2024-08-28 | Disposition: A | Discharge status: Home / Self Care | Source: Ambulatory Visit | Attending: General Surgery | Admitting: General Surgery

## 2024-08-28 DIAGNOSIS — Z9884 Bariatric surgery status: Secondary | ICD-10-CM

## 2024-09-04 ENCOUNTER — Ambulatory Visit: Payer: Self-pay | Admitting: General Surgery

## 2024-09-04 NOTE — Progress Notes (Signed)
 Sent message, via epic in basket, requesting orders in epic from Careers adviser.

## 2024-09-10 NOTE — Patient Instructions (Signed)
 SURGICAL WAITING ROOM VISITATION Patients having surgery or a procedure may have no more than 2 support people in the waiting area - these visitors may rotate in the visitor waiting room.   Due to an increase in RSV and influenza rates and associated hospitalizations, children ages 87 and under may not visit patients in [facility] hospitals. If the patient needs to stay at the hospital during part of their recovery, the visitor guidelines for inpatient rooms apply.  PRE-OP VISITATION  Pre-op nurse will coordinate an appropriate time for 1 support person to accompany the patient in pre-op.  This support person may not rotate.  This visitor will be contacted when the time is appropriate for the visitor to come back in the pre-op area.  Please refer to the [facility] website for the visitor guidelines for Inpatients (after your surgery is over and you are in a regular room).  You are not required to quarantine at this time prior to your surgery. However, you must do this: Hand Hygiene often Do NOT share personal items Notify your provider if you are in close contact with someone who has COVID or you develop fever 100.4 or greater, new onset of sneezing, cough, sore throat, shortness of breath or body aches.  If you test positive for Covid or have been in contact with anyone that has tested positive in the last 10 days please notify you surgeon.    Your procedure is scheduled on: 09/15/24   Report to [facility] Main Entrance: [place] entrance where the [employer] is available.   Report to admitting at: 5:15 AM  Call this number if you have any questions or problems the morning of surgery [phone]  FOLLOW ANY ADDITIONAL PRE OP INSTRUCTIONS YOU RECEIVED FROM YOUR SURGEON'S OFFICE!!!  Eat a light diet the day before surgery.  Examples including soups, broths, toast, yogurt, mashed potatoes.  Things to avoid include carbonated beverages (fizzy beverages), raw fruits and raw  vegetables, or beans.   If your bowels are filled with gas, your surgeon will have difficulty visualizing your pelvic organs which increases your surgical risks.  Do not eat food after Midnight the night prior to your surgery/procedure.  After Midnight you may have the following liquids until: 4:30 AM DAY OF SURGERY  Clear Liquid Diet Water  Black Coffee (sugar ok, NO MILK/CREAM OR CREAMERS)  Tea (sugar ok, NO MILK/CREAM OR CREAMERS) regular and decaf                             Plain Jell-O  with no fruit (NO RED)                                           Fruit ices (not with fruit pulp, NO RED)                                     Popsicles (NO RED)  Juice: NO CITRUS JUICES: only apple, WHITE grape, WHITE cranberry Sports drinks like Gatorade or Powerade (NO RED)   Oral Hygiene is also important to reduce your risk of infection.        Remember - BRUSH YOUR TEETH THE MORNING OF SURGERY WITH YOUR REGULAR TOOTHPASTE  Do NOT smoke after Midnight the night before surgery.  STOP TAKING all Vitamins, Herbs and supplements 1 week before your surgery.   Take ONLY these medicines the morning of surgery with A SIP OF WATER : Fluoxetine ,amlodipine ,levothyroxine ,pantoprazole .Tylenol  as needed.                   You may not have any metal on your body including hair pins, jewelry, and body piercing  Do not wear lotions, powders, perfumes / cologne, or deodorant  Do not wear nail polish including gel and S&S, artificial / acrylic nails, or any other type of covering on natural nails including finger and toenails. If you have artificial nails, gel coating, etc., that needs to be removed by a nail salon, Please have this removed prior to surgery. Not doing so may mean that your surgery could be cancelled or delayed if the Surgeon or anesthesia staff feels like they are unable to monitor you safely.   Do not shave 48 hours prior to  surgery to avoid nicks in your skin which may contribute to postoperative infections.   Contacts, Hearing Aids, dentures or bridgework may not be worn into surgery. DENTURES WILL BE REMOVED PRIOR TO SURGERY PLEASE DO NOT APPLY Poly grip OR ADHESIVES!!!  You may bring a small overnight bag with you on the day of surgery, only pack items that are not valuable. [facility] IS NOT RESPONSIBLE   FOR VALUABLES THAT ARE LOST OR STOLEN.   Patients discharged on the day of surgery will not be allowed to drive home.  Someone NEEDS to stay with you for the first 24 hours after anesthesia.  Do not bring your home medications to the hospital. The Pharmacy will dispense medications listed on your medication list to you during your admission in the Hospital.  Special Instructions: Bring a copy of your healthcare power of attorney and living will documents the day of surgery, if you wish to have them scanned into your [facility] Records- EPIC  Please read over the following fact sheets you were given: IF YOU HAVE QUESTIONS ABOUT YOUR PRE-OP INSTRUCTIONS, PLEASE CALL [phone]   [facility] - Preparing for Surgery      Before surgery, you can play an important role.  Because skin is not sterile, your skin needs to be as free of germs as possible.  You can reduce the number of germs on your skin by washing with CHG (chlorahexidine gluconate) soap before surgery.  CHG is an antiseptic cleaner which kills germs and bonds with the skin to continue killing germs even after washing. Please DO NOT use if you have an allergy to CHG or antibacterial soaps.  If your skin becomes reddened/irritated stop using the CHG and inform your nurse when you arrive at Short Stay. Do not shave (including legs and underarms) for at least 48 hours prior to the first CHG shower.  You may shave your face/neck.  Please follow these instructions carefully:  1.  Shower with CHG Soap the night before surgery ONLY (DO NOT USE THE  SOAP THE MORNING OF SURGERY).  2.  If you choose to wash your hair, wash your hair first as usual with your  normal  shampoo.  3.  After you shampoo, rinse your hair and body thoroughly to remove the shampoo.                             4.  Use CHG as you would any other liquid soap.  You can apply chg directly to the skin and wash.  Gently with a scrungie or clean washcloth.  5.  Apply the CHG Soap to your body ONLY FROM THE NECK DOWN.   Do not use on face/ open                           Wound or open sores. Avoid contact with eyes, ears mouth and genitals (private parts).                       Wash face,  Genitals (private parts) with your normal soap.             6.  Wash thoroughly, paying special attention to the area where your  surgery  will be performed.  7.  Thoroughly rinse your body with warm water  from the neck down.  8.  DO NOT shower/wash with your normal soap after using and rinsing off the CHG Soap.                9.  Pat yourself dry with a clean towel.            10.  Wear clean pajamas.            11.  Place clean sheets on your bed the night of your first shower and do not  sleep with pets.  Day of Surgery : Do not apply any CHG, lotions/deodorants the morning of surgery.  Please wear clean clothes to the hospital/surgery center.   FAILURE TO FOLLOW THESE INSTRUCTIONS MAY RESULT IN THE CANCELLATION OF YOUR SURGERY  PATIENT SIGNATURE_________________________________  NURSE SIGNATURE__________________________________  ________________________________________________________________________

## 2024-09-11 ENCOUNTER — Other Ambulatory Visit: Payer: Self-pay

## 2024-09-11 ENCOUNTER — Encounter (HOSPITAL_COMMUNITY): Payer: Self-pay

## 2024-09-11 ENCOUNTER — Encounter (HOSPITAL_COMMUNITY)
Admission: RE | Admit: 2024-09-11 | Discharge: 2024-09-11 | Disposition: A | Discharge status: Home / Self Care | Source: Ambulatory Visit | Attending: General Surgery | Admitting: General Surgery

## 2024-09-11 VITALS — BP 134/85 | HR 53 | Temp 97.8°F | Ht 64.0 in | Wt 221.0 lb

## 2024-09-11 DIAGNOSIS — M1990 Unspecified osteoarthritis, unspecified site: Secondary | ICD-10-CM | POA: Insufficient documentation

## 2024-09-11 DIAGNOSIS — K9509 Other complications of gastric band procedure: Secondary | ICD-10-CM | POA: Diagnosis not present

## 2024-09-11 DIAGNOSIS — Z9861 Coronary angioplasty status: Secondary | ICD-10-CM | POA: Insufficient documentation

## 2024-09-11 DIAGNOSIS — F419 Anxiety disorder, unspecified: Secondary | ICD-10-CM | POA: Insufficient documentation

## 2024-09-11 DIAGNOSIS — I10 Essential (primary) hypertension: Secondary | ICD-10-CM | POA: Diagnosis not present

## 2024-09-11 DIAGNOSIS — K219 Gastro-esophageal reflux disease without esophagitis: Secondary | ICD-10-CM | POA: Insufficient documentation

## 2024-09-11 DIAGNOSIS — Z87891 Personal history of nicotine dependence: Secondary | ICD-10-CM | POA: Insufficient documentation

## 2024-09-11 DIAGNOSIS — G4733 Obstructive sleep apnea (adult) (pediatric): Secondary | ICD-10-CM | POA: Insufficient documentation

## 2024-09-11 DIAGNOSIS — I2510 Atherosclerotic heart disease of native coronary artery without angina pectoris: Secondary | ICD-10-CM | POA: Diagnosis not present

## 2024-09-11 DIAGNOSIS — Z01818 Encounter for other preprocedural examination: Secondary | ICD-10-CM | POA: Insufficient documentation

## 2024-09-11 DIAGNOSIS — Y831 Surgical operation with implant of artificial internal device as the cause of abnormal reaction of the patient, or of later complication, without mention of misadventure at the time of the procedure: Secondary | ICD-10-CM | POA: Diagnosis not present

## 2024-09-11 DIAGNOSIS — F32A Depression, unspecified: Secondary | ICD-10-CM | POA: Diagnosis not present

## 2024-09-11 DIAGNOSIS — K449 Diaphragmatic hernia without obstruction or gangrene: Secondary | ICD-10-CM | POA: Diagnosis not present

## 2024-09-11 DIAGNOSIS — Z6837 Body mass index (BMI) 37.0-37.9, adult: Secondary | ICD-10-CM | POA: Diagnosis not present

## 2024-09-11 DIAGNOSIS — I252 Old myocardial infarction: Secondary | ICD-10-CM | POA: Insufficient documentation

## 2024-09-11 HISTORY — DX: Cardiac murmur, unspecified: R01.1

## 2024-09-11 HISTORY — DX: Anxiety disorder, unspecified: F41.9

## 2024-09-11 HISTORY — DX: Acute myocardial infarction, unspecified: I21.9

## 2024-09-11 LAB — CBC
HCT: 42 % (ref 36.0–46.0)
Hemoglobin: 12.6 g/dL (ref 12.0–15.0)
MCH: 27.7 pg (ref 26.0–34.0)
MCHC: 30 g/dL (ref 30.0–36.0)
MCV: 92.3 fL (ref 80.0–100.0)
Platelets: 246 K/uL (ref 150–400)
RBC: 4.55 MIL/uL (ref 3.87–5.11)
RDW: 14 % (ref 11.5–15.5)
WBC: 7 K/uL (ref 4.0–10.5)
nRBC: 0 % (ref 0.0–0.2)

## 2024-09-11 LAB — BASIC METABOLIC PANEL WITH GFR
Anion gap: 8 (ref 5–15)
BUN: 18 mg/dL (ref 8–23)
CO2: 26 mmol/L (ref 22–32)
Calcium: 9.5 mg/dL (ref 8.9–10.3)
Chloride: 106 mmol/L (ref 98–111)
Creatinine, Ser: 0.8 mg/dL (ref 0.44–1.00)
GFR, Estimated: >60 mL/min (ref >60)
Glucose, Bld: 93 mg/dL (ref 70–99)
Potassium: 4.7 mmol/L (ref 3.5–5.1)
Sodium: 139 mmol/L (ref 135–145)

## 2024-09-11 NOTE — Progress Notes (Addendum)
 For Anesthesia: PCP - Ferdie Odor, NP  Cardiologist - Jori Bathe. Records : requested.Media tab.  Bowel Prep reminder:  Chest Emilee Odor, NP -ray -  EKG - 09/11/24 Stress Test -  ECHO - 06/03/24: as per cardiologist report. Cardiac Cath - 06/16/24: media tab. Pacemaker/ICD device last checked: Pacemaker orders received: Device Rep notified:  Spinal Cord Stimulator: N/A  Sleep Study - Yes CPAP - Yes  Fasting Blood Sugar - N/A Checks Blood Sugar _____ times a day Date and result of last Hgb A1c-  Last dose of GLP1 agonist- N/A GLP1 instructions: Hold 7 days prior to schedule (Hold 24 hours-daily)   Last dose of SGLT-2 inhibitors- N/A SGLT-2 instructions: Hold 72 hours prior to surgery  Blood Thinner Instructions: Plavix  on hols since: 09/10/24 Last Dose: Time last taken:  Aspirin  Instructions: will be continue. Last Dose: Time last taken:  Activity level:    Unable to go up a flight of stairs without shortness of breath    Anesthesia review:Hx: CAD,HTN,Heart attack,OSA(CPAP).   Patient denies shortness of breath, fever, cough and chest pain at PAT appointment   Patient verbalized understanding of instructions that were reviewed over the telephone.

## 2024-09-12 ENCOUNTER — Encounter (HOSPITAL_COMMUNITY): Payer: Self-pay

## 2024-09-12 NOTE — Anesthesia Preprocedure Evaluation (Signed)
 Anesthesia Evaluation  Patient identified by MRN, date of birth, ID band Patient awake    Reviewed: Allergy & Precautions, H&P , NPO status , Patient's Chart, lab work & pertinent test results  Airway Mallampati: III  TM Distance: >3 FB Neck ROM: Full    Dental  (+) Teeth Intact, Dental Advisory Given   Pulmonary sleep apnea and Continuous Positive Airway Pressure Ventilation , former smoker   Pulmonary exam normal breath sounds clear to auscultation       Cardiovascular hypertension (155/84 preop, normally 130s SBP), Pt. on medications + CAD, + Past MI and + Cardiac Stents (2019 DES to LAD, plavix )  Normal cardiovascular exam Rhythm:Regular Rate:Normal    More recently she started having chest pain and DOE and underwent cardiac cath on 06/16/2024 which showed only mild to moderate nonobstructive disease and patent LAD stent.    EKG 09/11/2024 Sinus bradycardia, rate 46 Left axis deviation Moderate voltage criteria for LVH, may be normal variant ( R in aVL , Cornell product ) Septal infarct , age undetermined     CV:   Left heart cath 06/16/2024 (scanned into media):   Summary:   1.  Normal left ventricular systolic function with an ejection fraction of 60% to 65% 2.  Patent stent in the mid LAD 3. FFR of the LAD lesion shows the ratio was 0.83 suggesting this was not significant   Exercise stress Cardiolite scan 06/03/2024 (scanned into media):   EF was 61% The patient had hypertensive blood pressure response with peak blood pressure of 214/119 She had no chest pains and no EKG changes but a large sized moderate intensity reversible defect involving the anterior wall Beta-blockers could not be added in view of her baseline bradycardia and first-degree heart block   Echo 06/03/2024 (scanned into media):   EF 60 to 65% No significant valvular pathology    Neuro/Psych  PSYCHIATRIC DISORDERS Anxiety Depression    negative  neurological ROS     GI/Hepatic Neg liver ROS, hiatal hernia,GERD  Medicated and Poorly Controlled,,Reflux, slipped gastric band   Endo/Other  Hypothyroidism  Class 3 obesity (BMI 38)  Renal/GU negative Renal ROS  negative genitourinary   Musculoskeletal  (+) Arthritis , Osteoarthritis,    Abdominal  (+) + obese  Peds negative pediatric ROS (+)  Hematology negative hematology ROS (+)   Anesthesia Other Findings   Reproductive/Obstetrics negative OB ROS                              Anesthesia Physical Anesthesia Plan  ASA: 3  Anesthesia Plan: General   Post-op Pain Management: Tylenol  PO (pre-op)*   Induction: Intravenous and Rapid sequence  PONV Risk Score and Plan: 3 and Ondansetron , Dexamethasone , Midazolam  and Treatment may vary due to age or medical condition  Airway Management Planned: Oral ETT  Additional Equipment: None  Intra-op Plan:   Post-operative Plan: Extubation in OR  Informed Consent: I have reviewed the patients History and Physical, chart, labs and discussed the procedure including the risks, benefits and alternatives for the proposed anesthesia with the patient or authorized representative who has indicated his/her understanding and acceptance.     Dental advisory given  Plan Discussed with: CRNA  Anesthesia Plan Comments: (Last airway note: Laryngoscope Size: Mac and 3 Grade View: Grade I Tube type: Oral Tube size: 7.0 mm Number of attempts: 1  )         Anesthesia Quick Evaluation

## 2024-09-12 NOTE — Progress Notes (Signed)
 Case: [id number] Date/Time: 09/15/24 0715   Procedure: REMOVAL, GASTRIC BAND, LAPAROSCOPIC   Anesthesia type: General   Pre-op diagnosis: REFLUX SLIPPED LAP BAND   Location[facility] ROOM 02 / [facility]   Surgeons: Tanda Locus, MD       DISCUSSION: Elizabeth Bowen is a 73 yo female with PMH of former smoking, hypertension, history of MI and CAD s/p PCI to LAD (04/2018), OSA (uses CPAP), GERD, hiatal hernia, s/p gastric lap band (2005) , anxiety, depression, obesity, arthritis, cervical fusion C5-C7  Patient is having complications with her gastric band causing esophageal dilation and heartburn.  Now scheduled for surgery above  [facility].  Patient initially underwent cardiac cath due to NSTEMI in June 2019.  She had an LAD thrombus and is status post PCI to the LAD.  More recently she started having chest pain and DOE and underwent cardiac cath on 06/16/2024 which showed only mild to moderate nonobstructive disease and patent LAD stent.  Cardiac clearance scanned into media with following instructions:  May hold Plavix  5 to 7 days prior to surgery and restart ASAP postsurgery when okay with surgeons.  Do not stop aspirin .  Low risk for general anesthesia and Lap-Band removal surgery  Last dose Plavix : 10/15  VS: BP 134/85   Pulse (!) 53   Temp 36.6 C (Oral)   Ht 5' 4 (1.626 m)   Wt 100.2 kg   SpO2 100%   BMI 37.93 kg/m   PROVIDERS: Ferdie Odor, NP Cardiologist: Jori Bathe, MD  LABS: Labs reviewed: Acceptable for surgery. (all labs ordered are listed, but only abnormal results are displayed)  Labs Reviewed  CBC  BASIC METABOLIC PANEL WITH GFR     IMAGES:   EKG 09/11/2024 Sinus bradycardia, rate 46 Left axis deviation Moderate voltage criteria for LVH, may be normal variant ( R in aVL , Cornell product ) Septal infarct , age undetermined   CV:  Left heart cath 06/16/2024 (scanned into media):  Summary:  1.  Normal left ventricular  systolic function with an ejection fraction of 60% to 65% 2.  Patent stent in the mid LAD 3. FFR of the LAD lesion shows the ratio was 0.83 suggesting this was not significant  Exercise stress Cardiolite scan 06/03/2024 (scanned into media):  EF was 61% The patient had hypertensive blood pressure response with peak blood pressure of 214/119 She had no chest pains and no EKG changes but a large sized moderate intensity reversible defect involving the anterior wall Beta-blockers could not be added in view of her baseline bradycardia and first-degree heart block  Echo 06/03/2024 (scanned into media):  EF 60 to 65% No significant valvular pathology  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   Noted on EKG, [facility]   LVH (left ventricular hypertrophy) 05/21/2018   noted on EKG   Morbid obesity (HCC)    Myocardial infarction (HCC)    OSA on CPAP     Past Surgical History:  Procedure Laterality Date   ABDOMINAL HYSTERECTOMY     BACK SURGERY     Lumbar disc   CARPAL TUNNEL RELEASE Bilateral    CERVICAL FUSION     CHOLECYSTECTOMY     COLONOSCOPY     CORONARY ANGIOPLASTY  2019   Stent placement   KNEE CARTILAGE SURGERY Bilateral    bilateral   LAPAROSCOPIC GASTRIC BANDING  02/2004   TOTAL KNEE ARTHROPLASTY Left 07/16/2020   Procedure: TOTAL LEFT  KNEE ARTHROPLASTY;  Surgeon: Shari Sieving, MD;  Location: [facility];  Service: Orthopedics;  Laterality: Left;   TOTAL SHOULDER ARTHROPLASTY Right 02/12/2019   Procedure: TOTAL SHOULDER ARTHROPLASTY;  Surgeon: Cristy Bonner DASEN, MD;  Location: [facility];  Service: Orthopedics;   Laterality: Right;   UPPER GI ENDOSCOPY     WISDOM TOOTH EXTRACTION      MEDICATIONS:  acetaminophen  (TYLENOL ) 500 MG tablet   amLODipine  (NORVASC ) 10 MG tablet   aspirin  EC 81 MG tablet   Cholecalciferol (VITAMIN D-3) 125 MCG (5000 UT) TABS   clobetasol ointment (TEMOVATE) 0.05 %   clopidogrel  (PLAVIX ) 75 MG tablet   cyanocobalamin (VITAMIN B12) 1000 MCG tablet   docusate sodium  (COLACE) 100 MG capsule   FLUoxetine  (PROZAC ) 20 MG capsule   levothyroxine  (SYNTHROID ) 100 MCG tablet   lisinopril  (ZESTRIL ) 5 MG tablet   magnesium oxide (MAG-OX) 400 (240 Mg) MG tablet   pantoprazole  (PROTONIX ) 20 MG tablet   REPATHA SURECLICK 140 MG/ML SOAJ   No current facility-administered medications for this encounter.   Burnard CHRISTELLA Odis DEVONNA [facility] Short Stay/Anesthesiology [facility] Phone [phone] 09/12/2024 9:16 AM

## 2024-09-15 ENCOUNTER — Ambulatory Visit (HOSPITAL_COMMUNITY)
Admission: RE | Admit: 2024-09-15 | Discharge: 2024-09-15 | Disposition: A | Discharge status: Home / Self Care | Attending: General Surgery | Admitting: General Surgery

## 2024-09-15 ENCOUNTER — Encounter (HOSPITAL_COMMUNITY): Payer: Self-pay | Admitting: General Surgery

## 2024-09-15 ENCOUNTER — Other Ambulatory Visit: Payer: Self-pay

## 2024-09-15 ENCOUNTER — Encounter (HOSPITAL_COMMUNITY)
Admission: RE | Disposition: A | Discharge status: Home / Self Care | Payer: Self-pay | Source: Home / Self Care | Attending: General Surgery

## 2024-09-15 ENCOUNTER — Ambulatory Visit (HOSPITAL_COMMUNITY): Admitting: Anesthesiology

## 2024-09-15 ENCOUNTER — Ambulatory Visit (HOSPITAL_COMMUNITY): Payer: Self-pay | Admitting: Physician Assistant

## 2024-09-15 DIAGNOSIS — K2289 Other specified disease of esophagus: Secondary | ICD-10-CM | POA: Insufficient documentation

## 2024-09-15 DIAGNOSIS — I10 Essential (primary) hypertension: Secondary | ICD-10-CM | POA: Diagnosis not present

## 2024-09-15 DIAGNOSIS — Z801 Family history of malignant neoplasm of trachea, bronchus and lung: Secondary | ICD-10-CM | POA: Diagnosis not present

## 2024-09-15 DIAGNOSIS — Z87891 Personal history of nicotine dependence: Secondary | ICD-10-CM | POA: Insufficient documentation

## 2024-09-15 DIAGNOSIS — K224 Dyskinesia of esophagus: Secondary | ICD-10-CM | POA: Diagnosis not present

## 2024-09-15 DIAGNOSIS — E66813 Obesity, class 3: Secondary | ICD-10-CM | POA: Insufficient documentation

## 2024-09-15 DIAGNOSIS — Y733 Surgical instruments, materials and gastroenterology and urology devices (including sutures) associated with adverse incidents: Secondary | ICD-10-CM | POA: Diagnosis not present

## 2024-09-15 DIAGNOSIS — Z9884 Bariatric surgery status: Secondary | ICD-10-CM | POA: Insufficient documentation

## 2024-09-15 DIAGNOSIS — Z7902 Long term (current) use of antithrombotics/antiplatelets: Secondary | ICD-10-CM | POA: Insufficient documentation

## 2024-09-15 DIAGNOSIS — K219 Gastro-esophageal reflux disease without esophagitis: Secondary | ICD-10-CM | POA: Insufficient documentation

## 2024-09-15 DIAGNOSIS — I2510 Atherosclerotic heart disease of native coronary artery without angina pectoris: Secondary | ICD-10-CM | POA: Diagnosis not present

## 2024-09-15 DIAGNOSIS — G4733 Obstructive sleep apnea (adult) (pediatric): Secondary | ICD-10-CM | POA: Insufficient documentation

## 2024-09-15 DIAGNOSIS — E039 Hypothyroidism, unspecified: Secondary | ICD-10-CM | POA: Insufficient documentation

## 2024-09-15 DIAGNOSIS — Z7982 Long term (current) use of aspirin: Secondary | ICD-10-CM | POA: Diagnosis not present

## 2024-09-15 DIAGNOSIS — Z6838 Body mass index (BMI) 38.0-38.9, adult: Secondary | ICD-10-CM | POA: Insufficient documentation

## 2024-09-15 DIAGNOSIS — R079 Chest pain, unspecified: Secondary | ICD-10-CM | POA: Diagnosis present

## 2024-09-15 DIAGNOSIS — Z955 Presence of coronary angioplasty implant and graft: Secondary | ICD-10-CM | POA: Insufficient documentation

## 2024-09-15 DIAGNOSIS — T85528A Displacement of other gastrointestinal prosthetic devices, implants and grafts, initial encounter: Secondary | ICD-10-CM | POA: Diagnosis not present

## 2024-09-15 DIAGNOSIS — K449 Diaphragmatic hernia without obstruction or gangrene: Secondary | ICD-10-CM | POA: Insufficient documentation

## 2024-09-15 DIAGNOSIS — Z79899 Other long term (current) drug therapy: Secondary | ICD-10-CM | POA: Insufficient documentation

## 2024-09-15 SURGERY — REMOVAL, GASTRIC BAND, LAPAROSCOPIC
Anesthesia: General

## 2024-09-15 MED ORDER — 0.9 % SODIUM CHLORIDE (POUR BTL) OPTIME
TOPICAL | Status: DC | PRN
  Administered 2024-09-15: 1000 mL

## 2024-09-15 MED ORDER — HYDROMORPHONE HCL 1 MG/ML IJ SOLN
INTRAMUSCULAR | Status: AC
  Filled 2024-09-15: qty 2

## 2024-09-15 MED ORDER — LIDOCAINE HCL (PF) 2 % IJ SOLN
INTRAMUSCULAR | Status: AC
  Filled 2024-09-15: qty 5

## 2024-09-15 MED ORDER — TRAMADOL HCL 50 MG PO TABS: 50.0000 mg | ORAL_TABLET | Freq: Four times a day (QID) | ORAL | 0 refills | Status: AC | PRN

## 2024-09-15 MED ORDER — ACETAMINOPHEN 500 MG PO TABS
1000.0000 mg | ORAL_TABLET | Freq: Once | ORAL | Status: AC
  Administered 2024-09-15: 1000 mg via ORAL
  Filled 2024-09-15: qty 2

## 2024-09-15 MED ORDER — PROPOFOL 500 MG/50ML IV EMUL
INTRAVENOUS | Status: DC | PRN
  Administered 2024-09-15: 150 mg via INTRAVENOUS
  Administered 2024-09-15 (×2): 50 mg via INTRAVENOUS

## 2024-09-15 MED ORDER — CHLORHEXIDINE GLUCONATE CLOTH 2 % EX PADS: 6.0000 | MEDICATED_PAD | Freq: Once | CUTANEOUS | Status: DC

## 2024-09-15 MED ORDER — BUPIVACAINE-EPINEPHRINE (PF) 0.25% -1:200000 IJ SOLN
INTRAMUSCULAR | Status: AC
  Filled 2024-09-15: qty 30

## 2024-09-15 MED ORDER — CHLORHEXIDINE GLUCONATE 0.12 % MT SOLN
15.0000 mL | Freq: Once | OROMUCOSAL | Status: AC
  Administered 2024-09-15: 15 mL via OROMUCOSAL

## 2024-09-15 MED ORDER — OXYCODONE HCL 5 MG PO TABS
ORAL_TABLET | ORAL | Status: AC
  Filled 2024-09-15: qty 1

## 2024-09-15 MED ORDER — GLYCOPYRROLATE PF 0.2 MG/ML IJ SOSY
PREFILLED_SYRINGE | INTRAMUSCULAR | Status: DC | PRN
Start: 2024-09-15 — End: 2024-09-15
  Administered 2024-09-15: .2 mg via INTRAVENOUS

## 2024-09-15 MED ORDER — SUGAMMADEX SODIUM 200 MG/2ML IV SOLN
INTRAVENOUS | Status: DC | PRN
  Administered 2024-09-15: 50 mg via INTRAVENOUS
  Administered 2024-09-15: 200 mg via INTRAVENOUS

## 2024-09-15 MED ORDER — LIDOCAINE HCL (PF) 2 % IJ SOLN
INTRAMUSCULAR | Status: DC | PRN
  Administered 2024-09-15: 60 mg via INTRADERMAL

## 2024-09-15 MED ORDER — ROCURONIUM BROMIDE 10 MG/ML (PF) SYRINGE
PREFILLED_SYRINGE | INTRAVENOUS | Status: DC | PRN
Start: 2024-09-15 — End: 2024-09-15
  Administered 2024-09-15: 30 mg via INTRAVENOUS
  Administered 2024-09-15: 10 mg via INTRAVENOUS

## 2024-09-15 MED ORDER — HYDROMORPHONE HCL 1 MG/ML IJ SOLN
0.2500 mg | INTRAMUSCULAR | Status: DC | PRN
  Administered 2024-09-15 (×3): 0.25 mg via INTRAVENOUS
  Administered 2024-09-15: 0.5 mg via INTRAVENOUS

## 2024-09-15 MED ORDER — OXYCODONE HCL 5 MG PO TABS
5.0000 mg | ORAL_TABLET | Freq: Once | ORAL | Status: AC | PRN
  Administered 2024-09-15: 5 mg via ORAL

## 2024-09-15 MED ORDER — ORAL CARE MOUTH RINSE: 15.0000 mL | Freq: Once | OROMUCOSAL | Status: AC

## 2024-09-15 MED ORDER — PHENYLEPHRINE 80 MCG/ML (10ML) SYRINGE FOR IV PUSH (FOR BLOOD PRESSURE SUPPORT)
PREFILLED_SYRINGE | INTRAVENOUS | Status: AC
  Filled 2024-09-15: qty 10

## 2024-09-15 MED ORDER — CEFAZOLIN SODIUM-DEXTROSE 2-4 GM/100ML-% IV SOLN
2.0000 g | INTRAVENOUS | Status: DC
  Filled 2024-09-15: qty 100

## 2024-09-15 MED ORDER — PROPOFOL 10 MG/ML IV BOLUS
INTRAVENOUS | Status: AC
  Filled 2024-09-15: qty 20

## 2024-09-15 MED ORDER — DEXAMETHASONE SOD PHOSPHATE PF 10 MG/ML IJ SOLN
INTRAMUSCULAR | Status: DC | PRN
  Administered 2024-09-15: 10 mg via INTRAVENOUS

## 2024-09-15 MED ORDER — AMISULPRIDE (ANTIEMETIC) 5 MG/2ML IV SOLN
INTRAVENOUS | Status: AC
  Filled 2024-09-15: qty 4

## 2024-09-15 MED ORDER — LACTATED RINGERS IV SOLN
INTRAVENOUS | Status: DC
Start: 2024-09-15 — End: 2024-09-15

## 2024-09-15 MED ORDER — AMISULPRIDE (ANTIEMETIC) 5 MG/2ML IV SOLN
10.0000 mg | Freq: Once | INTRAVENOUS | Status: AC | PRN
  Administered 2024-09-15: 10 mg via INTRAVENOUS

## 2024-09-15 MED ORDER — ONDANSETRON HCL 4 MG/2ML IJ SOLN: 4.0000 mg | Freq: Once | INTRAMUSCULAR | Status: DC | PRN

## 2024-09-15 MED ORDER — ONDANSETRON HCL 4 MG/2ML IJ SOLN
INTRAMUSCULAR | Status: DC | PRN
Start: 2024-09-15 — End: 2024-09-15
  Administered 2024-09-15: 4 mg via INTRAVENOUS

## 2024-09-15 MED ORDER — ROCURONIUM BROMIDE 10 MG/ML (PF) SYRINGE
PREFILLED_SYRINGE | INTRAVENOUS | Status: AC
  Filled 2024-09-15: qty 10

## 2024-09-15 MED ORDER — ACETAMINOPHEN 500 MG PO TABS: 1000.0000 mg | ORAL_TABLET | ORAL | Status: AC

## 2024-09-15 MED ORDER — SUCCINYLCHOLINE CHLORIDE 200 MG/10ML IV SOSY
PREFILLED_SYRINGE | INTRAVENOUS | Status: DC | PRN
  Administered 2024-09-15: 120 mg via INTRAVENOUS

## 2024-09-15 MED ORDER — FENTANYL CITRATE (PF) 100 MCG/2ML IJ SOLN
INTRAMUSCULAR | Status: AC
  Filled 2024-09-15: qty 2

## 2024-09-15 MED ORDER — FENTANYL CITRATE (PF) 100 MCG/2ML IJ SOLN
INTRAMUSCULAR | Status: DC | PRN
  Administered 2024-09-15: 50 ug via INTRAVENOUS
  Administered 2024-09-15 (×2): 25 ug via INTRAVENOUS
  Administered 2024-09-15 (×2): 50 ug via INTRAVENOUS

## 2024-09-15 MED ORDER — SUGAMMADEX SODIUM 200 MG/2ML IV SOLN
INTRAVENOUS | Status: AC
  Filled 2024-09-15: qty 2

## 2024-09-15 MED ORDER — SUCCINYLCHOLINE CHLORIDE 200 MG/10ML IV SOSY
PREFILLED_SYRINGE | INTRAVENOUS | Status: AC
  Filled 2024-09-15: qty 10

## 2024-09-15 MED ORDER — OXYCODONE HCL 5 MG/5ML PO SOLN: 5.0000 mg | Freq: Once | ORAL | Status: AC | PRN

## 2024-09-15 MED ORDER — ONDANSETRON HCL 4 MG/2ML IJ SOLN
INTRAMUSCULAR | Status: AC
  Filled 2024-09-15: qty 2

## 2024-09-15 MED ORDER — BUPIVACAINE-EPINEPHRINE 0.25% -1:200000 IJ SOLN
INTRAMUSCULAR | Status: DC | PRN
  Administered 2024-09-15: 60 mL

## 2024-09-15 SURGICAL SUPPLY — 46 items
BAG COUNTER SPONGE SURGICOUNT (BAG) IMPLANT
BLADE SURG SZ11 CARB STEEL (BLADE) ×1 IMPLANT
CABLE HIGH FREQUENCY MONO STRZ (ELECTRODE) IMPLANT
CHLORAPREP W/TINT 26 (MISCELLANEOUS) ×1 IMPLANT
COVER SURGICAL LIGHT HANDLE (MISCELLANEOUS) ×1 IMPLANT
DEVICE SUTURE ENDOST 10MM (ENDOMECHANICALS) IMPLANT
DISSECTOR BLUNT TIP ENDO 5MM (MISCELLANEOUS) IMPLANT
DRAPE UTILITY XL STRL (DRAPES) ×1 IMPLANT
DRSG TEGADERM 2-3/8X2-3/4 SM (GAUZE/BANDAGES/DRESSINGS) IMPLANT
DRSG TEGADERM 4X4.75 (GAUZE/BANDAGES/DRESSINGS) IMPLANT
ELECT COATED BLADE 2.86 ST (ELECTRODE) ×1 IMPLANT
ELECT PENCIL ROCKER SW 15FT (MISCELLANEOUS) ×1 IMPLANT
ELECT REM PT RETURN 15FT ADLT (MISCELLANEOUS) ×1 IMPLANT
GAUZE SPONGE 2X2 8PLY STRL LF (GAUZE/BANDAGES/DRESSINGS) IMPLANT
GAUZE SPONGE 2X2 STRL 8-PLY (GAUZE/BANDAGES/DRESSINGS) ×1 IMPLANT
GLOVE BIO SURGEON STRL SZ7.5 (GLOVE) ×1 IMPLANT
GLOVE INDICATOR 8.0 STRL GRN (GLOVE) ×1 IMPLANT
GOWN STRL REUS W/ TWL XL LVL3 (GOWN DISPOSABLE) ×1 IMPLANT
GRASPER SUT TROCAR 14GX15 (MISCELLANEOUS) IMPLANT
IRRIGATION SUCT STRKRFLW 2 WTP (MISCELLANEOUS) IMPLANT
KIT BASIN OR (CUSTOM PROCEDURE TRAY) ×1 IMPLANT
KIT TURNOVER KIT A (KITS) ×1 IMPLANT
MAT PREVALON FULL STRYKER (MISCELLANEOUS) IMPLANT
NDL HYPO 22X1.5 SAFETY MO (MISCELLANEOUS) IMPLANT
NDL SPNL 22GX3.5 QUINCKE BK (NEEDLE) IMPLANT
NEEDLE HYPO 22X1.5 SAFETY MO (MISCELLANEOUS) IMPLANT
NEEDLE SPNL 22GX3.5 QUINCKE BK (NEEDLE) IMPLANT
NS IRRIG 1000ML POUR BTL (IV SOLUTION) ×1 IMPLANT
PACK UNIVERSAL I (CUSTOM PROCEDURE TRAY) ×1 IMPLANT
SCISSORS LAP 5X45 EPIX DISP (ENDOMECHANICALS) ×1 IMPLANT
SET TUBE SMOKE EVAC HIGH FLOW (TUBING) ×1 IMPLANT
SHEARS HARMONIC 45 ACE (MISCELLANEOUS) IMPLANT
SLEEVE ADV FIXATION 5X100MM (TROCAR) ×1 IMPLANT
SOLUTION ANTFG W/FOAM PAD STRL (MISCELLANEOUS) ×1 IMPLANT
SPIKE FLUID TRANSFER (MISCELLANEOUS) ×1 IMPLANT
SPONGE T-LAP 18X18 ~~LOC~~+RFID (SPONGE) ×1 IMPLANT
STRIP CLOSURE SKIN 1/2X4 (GAUZE/BANDAGES/DRESSINGS) ×1 IMPLANT
SUT MNCRL AB 4-0 PS2 18 (SUTURE) ×1 IMPLANT
SUT VIC AB 2-0 SH 27X BRD (SUTURE) ×1 IMPLANT
SUT VICRYL 0 TIES 12 18 (SUTURE) IMPLANT
SUT VICRYL 0 UR6 27IN ABS (SUTURE) IMPLANT
SYR 20ML LL LF (SYRINGE) ×1 IMPLANT
TOWEL OR 17X26 10 PK STRL BLUE (TOWEL DISPOSABLE) ×1 IMPLANT
TROCAR ADV FIXATION 12X100MM (TROCAR) ×1 IMPLANT
TROCAR ADV FIXATION 5X100MM (TROCAR) ×1 IMPLANT
TROCAR XCEL NON-BLD 5MMX100MML (ENDOMECHANICALS) ×1 IMPLANT

## 2024-09-15 NOTE — Anesthesia Procedure Notes (Signed)
 Procedure Name: Intubation Date/Time: 09/15/2024 7:46 AM  Performed by: Merla Almarie HERO, DOPre-anesthesia Checklist: Patient identified, Emergency Drugs available, Suction available and Patient being monitored Patient Re-evaluated:Patient Re-evaluated prior to induction Oxygen Delivery Method: Simple face mask Preoxygenation: Pre-oxygenation with 100% oxygen Induction Type: IV induction and Rapid sequence Laryngoscope Size: Mac and 3 Grade View: Grade I Tube size: 7.0 mm Number of attempts: 1 Placement Confirmation: positive ETCO2 and breath sounds checked- equal and bilateral Secured at: 21 cm Tube secured with: Tape Dental Injury: Teeth and Oropharynx as per pre-operative assessment

## 2024-09-15 NOTE — Discharge Instructions (Signed)

## 2024-09-15 NOTE — Transfer of Care (Signed)
 Immediate Anesthesia Transfer of Care Note  Patient: Elizabeth Bowen  Procedure(s) Performed: Procedure(s): REMOVAL, GASTRIC BAND, LAPAROSCOPIC (N/A)  Patient Location: PACU  Anesthesia Type:General  Level of Consciousness: Patient easily awoken, comfortable, cooperative, following commands, responds to stimulation.   Airway & Oxygen Therapy: Patient spontaneously breathing, ventilating well, oxygen via simple oxygen mask.  Post-op Assessment: Report given to PACU RN, vital signs reviewed and stable, moving all extremities.   Post vital signs: Reviewed and stable.  Complications: No apparent anesthesia complications  Last Vitals:  Vitals Value Taken Time  BP 147/119 09/15/24 0902  Temp    Pulse 92 09/15/24 09:00  Resp 18 09/15/24 09:00  SpO2 98 % 09/15/24 09:00  Vitals shown include unfiled device data.  Last Pain:  Vitals:   09/15/24 0645  TempSrc:   PainSc: 0-No pain      Patients Stated Pain Goal: 4 (09/15/24 9362)  Complications: No notable events documented.

## 2024-09-15 NOTE — H&P (Signed)
 CC: here for lap band removal  Requesting provider: n/a  HPI: Elizabeth Bowen is an 73 y.o. female who is here for lap removal of lap adjustable gastric band.  Denies changes since seen in clinic  Old hpi: In August 2025, she began experiencing chest pain. She has a history of a stent placement due to a LAD blockage. A stress test conducted in July 2025 was positive, but a subsequent cardiac catheterization in August 2025 showed no significant findings. Despite these results, she continued to experience chest pain and sought further evaluation. She is currently on Plavix  and baby aspirin  as part of her medication regimen.  She reported a persistent cough and a family history of lung cancer, prompting her to request a CT scan of the chest. The CT scan reportedly raised concern for a large hiatal hernia, as relayed to her. She also experienced fatigue and began cardiac rehabilitation recently.  She underwent a lap band adjustment in 2005, with fluid added and removed over the years. In August 2025, fluid was removed from the band after she experienced worsening symptoms post-cardiac catheterization. Since the fluid removal, she reports improvement in symptoms such as regurgitation and difficulty eating, which were previously occurring several times a week. She still experiences heartburn and takes approximately fifteen minutes to eat a meal. No regurgitation since fluid was removed.   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   Noted on EKG, [facility]   LVH (left ventricular hypertrophy) 05/21/2018   noted on EKG   Morbid obesity  (HCC)    Myocardial infarction (HCC)    OSA on CPAP     Past Surgical History:  Procedure Laterality Date   ABDOMINAL HYSTERECTOMY     BACK SURGERY     Lumbar disc   CARPAL TUNNEL RELEASE Bilateral    CERVICAL FUSION     CHOLECYSTECTOMY     COLONOSCOPY     CORONARY ANGIOPLASTY  2019   Stent placement   KNEE CARTILAGE SURGERY Bilateral    bilateral   LAPAROSCOPIC GASTRIC BANDING  02/2004   TOTAL KNEE ARTHROPLASTY Left 07/16/2020   Procedure: TOTAL LEFT  KNEE ARTHROPLASTY;  Surgeon: Shari Sieving, MD;  Location: [facility];  Service: Orthopedics;  Laterality: Left;   TOTAL SHOULDER ARTHROPLASTY Right 02/12/2019   Procedure: TOTAL SHOULDER ARTHROPLASTY;  Surgeon: Cristy Bonner DASEN, MD;  Location: [facility];  Service: Orthopedics;  Laterality: Right;   UPPER GI ENDOSCOPY     WISDOM TOOTH EXTRACTION      Family History  Problem Relation Age of Onset   Lung cancer Mother    Sick sinus syndrome Mother    Heart disease Mother    Lung cancer Father    Lung cancer Brother    Colon cancer Neg Hx    Rectal cancer Neg Hx    Stomach cancer Neg Hx    Esophageal cancer Neg Hx    Pancreatic cancer Neg Hx    Liver cancer Neg Hx  Social:  reports that she has quit smoking. Her smoking use included cigarettes. She has never been exposed to tobacco smoke. She has never used smokeless tobacco. She reports that she does not currently use alcohol. She reports that she does not use drugs.  Allergies:  Allergies  Allergen Reactions   Statins Other (See Comments)    Leg pain    Sulfa Antibiotics Rash   Vioxx [Rofecoxib] Rash    Medications: I have reviewed the patient's current medications.   ROS - all of the below systems have been reviewed with the patient and positives are indicated with bold text General: chills, fever or night sweats Eyes: blurry vision or double vision ENT: epistaxis or sore throat Allergy/Immunology: itchy/watery eyes or nasal congestion Hematologic/Lymphatic:  bleeding problems, blood clots or swollen lymph nodes Endocrine: temperature intolerance or unexpected weight changes Breast: new or changing breast lumps or nipple discharge Resp: cough, shortness of breath, or wheezing CV: chest pain or dyspnea on exertion GI: as per HPI GU: dysuria, trouble voiding, or hematuria MSK: joint pain or joint stiffness Neuro: TIA or stroke symptoms Derm: pruritus and skin lesion changes Psych: anxiety and depression  PE Blood pressure (!) 155/84, pulse (!) 50, temperature 97.9 F (36.6 C), temperature source Oral, resp. rate 16, height 5' 4 (1.626 m), weight 100.2 kg, SpO2 96%. Constitutional: NAD; conversant; no deformities; obesity Eyes: Moist conjunctiva; no lid lag; anicteric; PERRL Neck: Trachea midline; no thyromegaly Lungs: Normal respiratory effort; no tactile fremitus CV: RRR; no palpable thrills; no pitting edema GI: Abd soft, nt, nd, old trocar scars; port palpable; no palpable hepatosplenomegaly MSK: Normal gait; no clubbing/cyanosis Psychiatric: Appropriate affect; alert and oriented x3 Lymphatic: No palpable cervical or axillary lymphadenopathy Skin:no rash  No results found for this or any previous visit (from the past 48 hours).  No results found.  Imaging: Reviewed outsided imaging  A/P: Elizabeth Bowen is an 73 y.o. female with  H/o LAGB Hiatal hernia with slipped lap band Esophageal motility  To OR for lap removal adjustable gastric band for atypical chest pain, esophageal dysmotility IV abx All questions asked and answered  Elizabeth Bowen. Tanda, MD, FACS General, Bariatric, & Minimally Invasive Surgery [facility]

## 2024-09-15 NOTE — Op Note (Signed)
 09/15/2024  9:13 AM  PATIENT:  Elizabeth Bowen  73 y.o. female  PRE-OPERATIVE DIAGNOSIS: Chest pain/esophageal dysmotility/dilated esophagus, hiatal hernia; history of laparoscopic adjustable gastric band placement 2005  POST-OPERATIVE DIAGNOSIS: same  PROCEDURE:  Procedure(s): REMOVAL, GASTRIC BAND, LAPAROSCOPIC  SURGEON:  Surgeon(s): Tanda Locus, MD  ASSISTANTS: Kinsinger, Herlene Righter, MD   ANESTHESIA:   general  EBL: minimal  DRAINS: none   LOCAL MEDICATIONS USED:  MARCAINE      SPECIMEN:  Source of Specimen:  Lap-Band and components  DISPOSITION OF SPECIMEN:  Discarded  COUNTS:  YES  INDICATION FOR PROCEDURE: 73 year old female who has a very remote history of laparoscopic adjustable gastric band placement in 2005 started experiencing chest pain a few months ago.  She underwent an exhaustive cardiac workup which was all normal.  She had also developed some cough as well.  Imaging at outside facility revealed a dilated esophagus with a hiatal hernia in the setting of her Lap-Band.  I have recommended a stepwise plan to try to ameliorate her symptoms.  I recommended removal of her laparoscopic adjustable gastric band to see if that would improve her symptoms before attempting hiatal hernia repair since her BMI was above 35.  Risk and benefits were documented and separately discussed  PROCEDURE: After obtaining informed consent the patient was taken to the OR to at [facility] and placed supine on operating room table.  General endotracheal anesthesia was established.  Sequential compression devices were placed.  Her abdomen was prepped and draped in usual standard surgical fashion with ChloraPrep.  IV antibiotic was administered.  Surgical timeout was performed.  Patient had had a lower midline incision.  She also had old trocar scars.  Access to the abdomen was made in the left upper quadrant at Palmer's point using the Optiview technique.  A small incision was made and then  a 0 degree 5 mm laparoscope through a 5 mm trocar was advanced through all layers on the abdominal wall under direct visualization.  The abdominal cavity was entered.  Pneumoperitoneum was smoothly established up to a patient pressure of 15 mmHg without any change in patient vital signs.  The laparoscope was advanced to the abdominal cavity was surveilled.  There was no evidence of injury to surrounding structures.  The patient had some omental adhesions starting at the level of the umbilicus and going down toward the pelvis.  These were left alone.  The patient was placed in reverse Trendelenburg position.  Additional trocars were placed.  A 5 mm trocar above into the left of the umbilicus, a 5 mm trocar in the right lateral abdominal wall a 12 mm trocar in the right mid abdominal wall near the band port and a 5 mm trocar in the subxiphoid position.  The subxiphoid 5 Miller trocar was exchanged for a liver retractor to lift up the left lobe of the liver.  There was no adhesions between the left lobe of the liver and the stomach.  The Lap-Band was oriented correctly.  There appeared to be hiatal hernia.  1 or 2 of the fundoplication sutures were still intact.  The capsule directly over the Lap-Band was incised with hook electrocautery freeing the buckle of the band.  We then cut the band in order to facilitate its removal.  Some of the additional cicatrix on top of the band was taken down with hook electrocautery.  The stomach was not entered.  We were able to remove the band from around the stomach.  Part of the band was removed from the abdominal cavity.  I then removed some of the cicatrix with scissors without electrocautery that was directly on top of the stomach.  The stomach appeared to already be dilating/relaxing.  The band tubing in the abdomen was trimmed and that tubing was brought out through the 12 mm trocar.  We then removed the 12 mm trocar and I closed that fascial defect with an interrupted 0  Vicryl with a PMI suture passer using laparoscopic guidance.  Additional local was infiltrated.  The liver retractor was removed with no evidence of injury to the liver.  Pneumoperitoneum was released.  We extended the 12 mm trocar incision to patient's right.  Subcutaneous tissue was divided with electrocautery.  The Lap-Band port was encountered and the capsule surrounding it was incised with electrocautery.  We removed the remaining piece of the Lap-Band-its port and remaining tubing.  We then reassembled the Lap-Band on the back table to ensure all components were present.  Hemostasis was achieved in the right mid abdominal incision after removing the remaining cicatrix in the soft tissue.  I then closed the incision in 2 layers-interrupted 2-0 Vicryl's and a 4 Monocryl.  Remaining skin incisions were closed with a 4 Monocryl followed by application of Steri-Strips, 2 x 2 and Tegaderm.  All needle, instrument and sponge counts were correct x 2.  There were no immediate complications.  The patient was extubated and taken recovery room in stable condition  PLAN OF CARE: Discharge to home after PACU  PATIENT DISPOSITION:  PACU - hemodynamically stable.   Delay start of Pharmacological VTE agent (>24hrs) due to surgical blood loss or risk of bleeding:  not applicable  Camellia HERO. Tanda, MD, FACS General, Bariatric, & Minimally Invasive Surgery [facility], [facility]

## 2024-09-15 NOTE — Anesthesia Postprocedure Evaluation (Signed)
 Anesthesia Post Note  Patient: Elizabeth Bowen  Procedure(s) Performed: REMOVAL, GASTRIC BAND, LAPAROSCOPIC     Patient location during evaluation: PACU Anesthesia Type: General Level of consciousness: awake and alert, oriented and patient cooperative Pain management: pain level controlled Vital Signs Assessment: post-procedure vital signs reviewed and stable Respiratory status: spontaneous breathing, nonlabored ventilation and respiratory function stable Cardiovascular status: blood pressure returned to baseline and stable Postop Assessment: no apparent nausea or vomiting Anesthetic complications: no   No notable events documented.  Last Vitals:  Vitals:   09/15/24 1030 09/15/24 1047  BP: 130/72 (!) 163/90  Pulse: 80 77  Resp: 12 15  Temp: 36.5 C (!) 35.7 C  SpO2: 92% 95%    Last Pain:  Vitals:   09/15/24 1047  TempSrc: Axillary  PainSc: 3                  [name]
